# Patient Record
Sex: Female | Born: 1965 | Race: White | Hispanic: No | Marital: Married | Smoking: Former smoker
Health system: Southern US, Community
[De-identification: ages and names within clinical notes are randomized; demographics above are authoritative.]

## PROBLEM LIST (undated history)

## (undated) ENCOUNTER — Emergency Department (HOSPITAL_COMMUNITY): Discharge status: Absent without leave | Payer: Self-pay

## (undated) DIAGNOSIS — M5127 Other intervertebral disc displacement, lumbosacral region: Secondary | ICD-10-CM

## (undated) DIAGNOSIS — G4730 Sleep apnea, unspecified: Secondary | ICD-10-CM

## (undated) DIAGNOSIS — D649 Anemia, unspecified: Secondary | ICD-10-CM

## (undated) DIAGNOSIS — R51 Headache: Secondary | ICD-10-CM

## (undated) DIAGNOSIS — F329 Major depressive disorder, single episode, unspecified: Secondary | ICD-10-CM

## (undated) DIAGNOSIS — J189 Pneumonia, unspecified organism: Secondary | ICD-10-CM

## (undated) DIAGNOSIS — K219 Gastro-esophageal reflux disease without esophagitis: Secondary | ICD-10-CM

## (undated) DIAGNOSIS — F32A Depression, unspecified: Secondary | ICD-10-CM

## (undated) DIAGNOSIS — I10 Essential (primary) hypertension: Secondary | ICD-10-CM

## (undated) DIAGNOSIS — E7800 Pure hypercholesterolemia, unspecified: Secondary | ICD-10-CM

## (undated) HISTORY — PX: NOSE SURGERY: SHX723

## (undated) HISTORY — PX: BACK SURGERY: SHX140

## (undated) HISTORY — PX: KNEE SURGERY: SHX244

---

## 1997-10-17 ENCOUNTER — Observation Stay (HOSPITAL_COMMUNITY): Admission: AD | Admit: 1997-10-17 | Discharge: 1997-10-18 | Payer: Self-pay | Admitting: *Deleted

## 1997-11-03 ENCOUNTER — Inpatient Hospital Stay (HOSPITAL_COMMUNITY): Admission: AD | Admit: 1997-11-03 | Discharge: 1997-11-03 | Payer: Self-pay | Admitting: *Deleted

## 1997-11-08 ENCOUNTER — Inpatient Hospital Stay (HOSPITAL_COMMUNITY): Admission: AD | Admit: 1997-11-08 | Discharge: 1997-11-11 | Payer: Self-pay | Admitting: *Deleted

## 1997-11-18 ENCOUNTER — Encounter: Admission: RE | Admit: 1997-11-18 | Discharge: 1998-02-16 | Payer: Self-pay | Admitting: *Deleted

## 1998-10-10 ENCOUNTER — Encounter: Admission: RE | Admit: 1998-10-10 | Discharge: 1998-12-01 | Payer: Self-pay | Admitting: Specialist

## 1999-06-11 ENCOUNTER — Other Ambulatory Visit: Admission: RE | Admit: 1999-06-11 | Discharge: 1999-06-11 | Payer: Self-pay | Admitting: Family Medicine

## 2000-11-10 ENCOUNTER — Encounter: Payer: Self-pay | Admitting: Family Medicine

## 2000-11-10 ENCOUNTER — Encounter: Admission: RE | Admit: 2000-11-10 | Discharge: 2000-11-10 | Payer: Self-pay | Admitting: Family Medicine

## 2001-01-21 ENCOUNTER — Other Ambulatory Visit: Admission: RE | Admit: 2001-01-21 | Discharge: 2001-01-21 | Payer: Self-pay | Admitting: Emergency Medicine

## 2002-03-23 ENCOUNTER — Other Ambulatory Visit: Admission: RE | Admit: 2002-03-23 | Discharge: 2002-03-23 | Payer: Self-pay | Admitting: Internal Medicine

## 2002-07-28 ENCOUNTER — Encounter: Admission: RE | Admit: 2002-07-28 | Discharge: 2002-10-26 | Payer: Self-pay | Admitting: Family Medicine

## 2003-04-19 ENCOUNTER — Other Ambulatory Visit: Admission: RE | Admit: 2003-04-19 | Discharge: 2003-04-19 | Payer: Self-pay | Admitting: Family Medicine

## 2003-12-28 ENCOUNTER — Ambulatory Visit (HOSPITAL_COMMUNITY): Admission: RE | Admit: 2003-12-28 | Discharge: 2003-12-28 | Payer: Self-pay | Admitting: Family Medicine

## 2004-05-02 ENCOUNTER — Other Ambulatory Visit: Admission: RE | Admit: 2004-05-02 | Discharge: 2004-05-02 | Payer: Self-pay | Admitting: Family Medicine

## 2004-09-07 ENCOUNTER — Encounter: Admission: RE | Admit: 2004-09-07 | Discharge: 2004-09-07 | Payer: Self-pay | Admitting: Specialist

## 2004-10-10 ENCOUNTER — Encounter: Admission: RE | Admit: 2004-10-10 | Discharge: 2004-10-10 | Payer: Self-pay | Admitting: Specialist

## 2004-11-22 ENCOUNTER — Observation Stay (HOSPITAL_COMMUNITY): Admission: RE | Admit: 2004-11-22 | Discharge: 2004-11-23 | Payer: Self-pay | Admitting: Specialist

## 2004-11-22 ENCOUNTER — Encounter (INDEPENDENT_AMBULATORY_CARE_PROVIDER_SITE_OTHER): Payer: Self-pay | Admitting: *Deleted

## 2005-05-09 ENCOUNTER — Other Ambulatory Visit: Admission: RE | Admit: 2005-05-09 | Discharge: 2005-05-09 | Payer: Self-pay | Admitting: Family Medicine

## 2005-07-22 DIAGNOSIS — J189 Pneumonia, unspecified organism: Secondary | ICD-10-CM

## 2005-07-22 HISTORY — DX: Pneumonia, unspecified organism: J18.9

## 2005-12-14 ENCOUNTER — Emergency Department (HOSPITAL_COMMUNITY): Admission: EM | Admit: 2005-12-14 | Discharge: 2005-12-14 | Payer: Self-pay | Admitting: Emergency Medicine

## 2006-05-15 ENCOUNTER — Other Ambulatory Visit: Admission: RE | Admit: 2006-05-15 | Discharge: 2006-05-15 | Payer: Self-pay | Admitting: Family Medicine

## 2007-05-26 ENCOUNTER — Other Ambulatory Visit: Admission: RE | Admit: 2007-05-26 | Discharge: 2007-05-26 | Payer: Self-pay | Admitting: Family Medicine

## 2007-08-26 ENCOUNTER — Ambulatory Visit (HOSPITAL_COMMUNITY): Admission: RE | Admit: 2007-08-26 | Discharge: 2007-08-26 | Payer: Self-pay | Admitting: Family Medicine

## 2008-01-21 ENCOUNTER — Observation Stay (HOSPITAL_COMMUNITY): Admission: RE | Admit: 2008-01-21 | Discharge: 2008-01-22 | Payer: Self-pay | Admitting: Specialist

## 2008-07-06 ENCOUNTER — Other Ambulatory Visit: Admission: RE | Admit: 2008-07-06 | Discharge: 2008-07-06 | Payer: Self-pay | Admitting: Family Medicine

## 2010-07-17 ENCOUNTER — Ambulatory Visit (HOSPITAL_COMMUNITY)
Admission: RE | Admit: 2010-07-17 | Discharge: 2010-07-17 | Payer: Self-pay | Source: Home / Self Care | Attending: Family Medicine | Admitting: Family Medicine

## 2010-09-07 ENCOUNTER — Other Ambulatory Visit: Payer: Self-pay | Admitting: Family Medicine

## 2010-09-07 ENCOUNTER — Other Ambulatory Visit (HOSPITAL_COMMUNITY)
Admission: RE | Admit: 2010-09-07 | Discharge: 2010-09-07 | Disposition: A | Discharge status: Home / Self Care | Payer: Self-pay | Source: Ambulatory Visit | Attending: Family Medicine | Admitting: Family Medicine

## 2010-09-07 DIAGNOSIS — Z124 Encounter for screening for malignant neoplasm of cervix: Secondary | ICD-10-CM | POA: Insufficient documentation

## 2010-12-04 NOTE — Op Note (Signed)
Brianna Black, Brianna Black              ACCOUNT NO.:  [id number]   MEDICAL RECORD NO.:  [id number]          PATIENT TYPE:  AMB   LOCATION:  DAY                          FACILITY:  [facility]   PHYSICIAN:  Jene Every, M.D.    DATE OF BIRTH:  [birth year]   DATE OF PROCEDURE:  01/21/2008  DATE OF DISCHARGE:                               OPERATIVE REPORT   PREOPERATIVE DIAGNOSES:  1. Spinal stenosis.  2. Herniated nucleus pulposus, L5-S1, right.   POSTOPERATIVE DIAGNOSES:  1. Spinal stenosis.  2. Herniated nucleus pulposus, L5-S1, right.   PROCEDURES PERFORMED:  1. Lateral recess decompression.  2. Foraminotomy of S1.  3. Microdiskectomy, L5-S1.   ANESTHESIA:  General.   ASSISTANT:  Roma Schanz, P.A.   BRIEF HISTORY AND INDICATION:  A 45 year old with right lower extremity  radiculopathy, paracentral disk herniation 5-1, with disk herniation and  history of a disk degeneration and HNP at 4-5, the MRI indicating  paracentral to the right with S1 nerve root compression.  On exam today  preoperatively, she had a positive straight leg raise on the right,  negative on the left.  Radiation of the pain into the lateral aspect of  the foot in the S1 dermatome.  She had no L5 symptoms.   Operative intervention is indicated for decompression of the S1 nerve  root.  Risks and benefits discussed including bleeding, infection,  damage to vascular structures, CSF leakage, epidural fibrosis, the need  for fusion in the future, anesthetic complications, etc.   DESCRIPTION OF PROCEDURE:  With the patient in supine position after  induction of adequate anesthesia and 2 grams of Kefzol, she was placed  prone on the [place] frame.  All bony points were well padded.  Lumbar  region prepped and draped in the usual sterile fashion.  Two 18 gauge  spinal needles utilized to localize the 5-1 interspace, confirmed with x-  ray.   Incision made from the spinous process of L5-S1.  Subcutaneous tissue  was dissected.  Cautery was used to achieve hemostasis.  Dorsolumbar  fascia identified by the skin incision.  Paraspinous muscle elevated  from lamina of L5 and S1.  McCullough retractor was placed.  Operating  microscope was draped on the surgical field.  Second confirmatory  radiograph obtained with a Penfield 4r in the interlaminar space.  Straight curette was utilized to detach ligamentum flavum from the  cephalad edge of S1.  High-speed bur was utilized to form a  hemilaminotomy at L5.  There was hypertrophic facet noted.  A 2 mm  Kerrison was utilized to decompress the facet to the medial border of  pedicle.  Twenty-five percent of that was removed.  Ligamentum flavum  removed from the interspace.  It was a small interspace, fairly severe  stenosis laterally was noted.  She had an epidural venous plexus noted.  A 2 mm Kerrison was utilized to form a foraminotomy of S1 to identify  the S1 nerve root.  When this was identified and gently mobilized,  medially, we decompressed the lateral recess of the medial border of  pedicle.  Hemilaminotomy was completed  and the caudad edge of 5  ligamentum flavum removed.  There was a vascular leash over the S1 nerve  root.  This was divided and lysed to further mobilize the S1 nerve root.  Focal HNP was noted.  Annulotomy was performed.  Copious portions of  disk material were removed from the disk space with a straight and  upbiting pituitary.  It was further mobilized with a nerve hook and a  hockey stick.  After multiple passes were made, a full diskectomy of  herniated material was obtained.  Next, a hockey stick probe was placed  out the foramen of S1 and 5, and found be widely patent.  This space was  copiously irrigated with antibiotic irrigation.  Inspection revealed no  CSF leakage, but at least an excursion of 1 cm medial to the pedicle.  Next inspection revealed no evidence of CSF leakage or active bleeding.  The McCullough retractor  was removed.  Paraspinous muscle inspected, no  evidence of active bleeding.  Dorsolumbar fascia reapproximated with #1  Vicryl interrupted figure-of-eight sutures.  Subcutaneous tissue  reapproximated with 2-0 Vicryl simple sutures.  Skin was reapproximated  with 4 subcuticular Prolene.  Wound reinforced with Steri-Strips.  Sterile dressing applied.  Placed supine on the hospital bed, extubated  without difficulty and transported to the recovery room in satisfactory  condition.  Blood loss is minimal.      Jene Every, M.D.  Electronically Signed     JB/MEDQ  D:  01/21/2008  T:  01/21/2008  Job:  [id number]

## 2010-12-07 NOTE — Op Note (Signed)
Brianna Black, Brianna Black              ACCOUNT NO.:  [id number]   MEDICAL RECORD NO.:  [id number]          PATIENT TYPE:  AMB   LOCATION:  DAY                          FACILITY:  [facility]   PHYSICIAN:  Jene Every, M.D.    DATE OF BIRTH:  July 04, 1966   DATE OF PROCEDURE:  11/22/2004  DATE OF DISCHARGE:                                 OPERATIVE REPORT   PREOPERATIVE DIAGNOSIS:  Spinal stenosis, herniated nucleus pulposus, L4-5  on left.   POSTOPERATIVE DIAGNOSIS:  Spinal stenosis, herniated nucleus pulposus, L4-5  on left.   PROCEDURE PERFORMED:  Lateral recess decompression, foraminotomy of L5,  microdiskectomy L4-5.   ANESTHESIA:  General.   ASSISTANT:  Roma Schanz, P.A.   BRIEF HISTORY AND INDICATIONS:  This is a 45 year old female with L5  radiculopathy, positive neural tension sign, EHL weakness with a MRI  indicating lateral recess stenosis and disk protrusion. She had been  refractory to conservative treatment including epidural steroid injection  which gave her temporary relief, protecting the L4-5 space. It was  diagnostic and temporarily therapeutic, positive neural tension sign, EHL  weakness and the MRI indicating lateral recess stenosis which positionally  was exacerbated, and she was indicated for decompression of the L5 nerve  root. Risks and benefits were discussed including bleeding, infection,  damage to vascular structures, CSF leakage, epidural fibrosis, adjacent  segment disease, need for fusion in the future.   The patient was placed in the supine position. After the induction of  adequate general anesthesia and 1 g of Kefzol, she was placed prone on the  [place] frame, all bony prominences well padded. Lumbar region was prepped  and draped in the usual sterile fashion. A 22-gauge spinal needle was  utilized to localize the 4-5 interspace and was confirmed with an x-ray.  Incision was made from spinous process of 4-5. Subcutaneous tissue  dissected.  Electrocautery was utilized to achieve hemostasis.  The  dorsolumbar fascia was identified divided in line with the skin incision.  The paraspinous muscle was elevated from the lamina of 4 and 5. A McCullough  retractor was placed and a Penfield 4 in the intralaminar space at above and  below the lamina, presumed to be of 4. Confirmatory radiograph obtained, a  lower Penfield 4 in the intralaminar space, 4-5. The McCullough retractor  was between the spinous processes at 4 and 5. The patient had slight laminar  and facet trophism as on the AP by direct visualization. There was an  enlarged facet and a prominence off the inferior aspect of the L4 lamina.  Hemilaminotomy in the caudad edge of L4 was performed with 3-mm Kerrison.  Ligamentum flavum detached from the cephalad edge of 5. Noted immediately  was severe compression at the L5 nerve root into the lateral recess.  We  meticulously performed a L5 foraminotomy. The facet prominence was projected  into and compressing the L5 root along with a HNP. I decompressed the facet  to the median border of the pedicle. Ligamentum flavum removed from the  interspace. The hemilaminotomy at 4 carried to the insertion of the  ligamentum.  We then gently mobilized the root medially. Noted there were  remnants of the epidural steroid injection which was an opaque substance  consistent with that, and that was removed as well. It was right in the area  where the nerve was compressed. It was a vascular release, it was divided  and cauterized. There was a focal HNP.  I performed an annulotomy and  diskectomy of herniated material by a straight and upbiting pituitary.  I  checked the axilla beneath the thecal sac __with no______________ residual  compression. The root was freely mobile approximately a centimeter medial to  the pedicle. Electrocautery was utilized to achieve hemostasis. Copiously  irrigated the disk space with antibiotic irrigation. Again noted  the  excellent decompression of the 5 root noted. It was pulsatile, no evidence  of CSF leakage, placed thrombin soaked Gelfoam in the laminotomy defect.  Removed the McCullough retractor, paraspinous muscle was inspected, no  evidence of active bleeding and the dorsolumbar fascia was reapproximated  with #1 Vicryl figure-of-eight sutures. Subcutaneous tissue reapproximated  with 2-0 Vicryl simple sutures. Skin was reapproximated with 4-0  subcuticular Prolene. The wound was reinforced with Steri-Strips, sterile  dressing applied. Placed supine on the hospital bed, extubated without  difficulty, transported to the recovery room in satisfactory condition.   The patient tolerated the procedure well with no complications.      JB/MEDQ  D:  11/22/2004  T:  11/22/2004  Job:  [id number]

## 2011-04-18 LAB — URINALYSIS, ROUTINE W REFLEX MICROSCOPIC
Bilirubin Urine: NEGATIVE
Glucose, UA: NEGATIVE
Ketones, ur: NEGATIVE
Leukocytes, UA: NEGATIVE
Nitrite: NEGATIVE
Protein, ur: NEGATIVE
Urobilinogen, UA: 0.2

## 2011-04-18 LAB — COMPREHENSIVE METABOLIC PANEL
ALT: 14
Albumin: 3.6
Alkaline Phosphatase: 61
BUN: 16
Potassium: 3.2 — ABNORMAL LOW
Sodium: 141
Total Bilirubin: 0.5

## 2011-04-18 LAB — APTT: aPTT: 31

## 2011-04-18 LAB — CBC
MCHC: 33.8
RBC: 4.31
RDW: 14.6
WBC: 7

## 2011-04-18 LAB — BASIC METABOLIC PANEL
BUN: 6
Calcium: 8.4
GFR calc non Af Amer: >60
Sodium: 138

## 2011-04-18 LAB — URINE MICROSCOPIC-ADD ON

## 2011-04-18 LAB — PREGNANCY, URINE: Preg Test, Ur: NEGATIVE

## 2011-07-18 ENCOUNTER — Other Ambulatory Visit (HOSPITAL_COMMUNITY): Payer: Self-pay | Admitting: Family Medicine

## 2011-07-18 DIAGNOSIS — Z1231 Encounter for screening mammogram for malignant neoplasm of breast: Secondary | ICD-10-CM

## 2011-08-22 ENCOUNTER — Ambulatory Visit (HOSPITAL_COMMUNITY)
Admission: RE | Admit: 2011-08-22 | Discharge: 2011-08-22 | Disposition: A | Discharge status: Home / Self Care | Payer: 59 | Source: Ambulatory Visit | Attending: Family Medicine | Admitting: Family Medicine

## 2011-08-22 DIAGNOSIS — Z1231 Encounter for screening mammogram for malignant neoplasm of breast: Secondary | ICD-10-CM | POA: Insufficient documentation

## 2012-06-23 ENCOUNTER — Other Ambulatory Visit: Payer: Self-pay | Admitting: Family Medicine

## 2012-06-23 DIAGNOSIS — R140 Abdominal distension (gaseous): Secondary | ICD-10-CM

## 2012-06-26 ENCOUNTER — Ambulatory Visit
Admission: RE | Admit: 2012-06-26 | Discharge: 2012-06-26 | Disposition: A | Discharge status: Home / Self Care | Payer: 59 | Source: Ambulatory Visit | Attending: Family Medicine | Admitting: Family Medicine

## 2012-06-26 DIAGNOSIS — R140 Abdominal distension (gaseous): Secondary | ICD-10-CM

## 2012-07-14 ENCOUNTER — Other Ambulatory Visit: Payer: Self-pay | Admitting: Gastroenterology

## 2013-06-18 ENCOUNTER — Emergency Department (HOSPITAL_COMMUNITY)
Admission: EM | Admit: 2013-06-18 | Discharge: 2013-06-18 | Disposition: A | Discharge status: Home / Self Care | Payer: BC Managed Care – PPO | Attending: Emergency Medicine | Admitting: Emergency Medicine

## 2013-06-18 ENCOUNTER — Encounter (HOSPITAL_COMMUNITY): Payer: Self-pay | Admitting: Emergency Medicine

## 2013-06-18 DIAGNOSIS — M545 Low back pain, unspecified: Secondary | ICD-10-CM

## 2013-06-18 DIAGNOSIS — I10 Essential (primary) hypertension: Secondary | ICD-10-CM | POA: Insufficient documentation

## 2013-06-18 DIAGNOSIS — Z9889 Other specified postprocedural states: Secondary | ICD-10-CM | POA: Insufficient documentation

## 2013-06-18 DIAGNOSIS — E7800 Pure hypercholesterolemia, unspecified: Secondary | ICD-10-CM | POA: Insufficient documentation

## 2013-06-18 DIAGNOSIS — Z79899 Other long term (current) drug therapy: Secondary | ICD-10-CM | POA: Insufficient documentation

## 2013-06-18 DIAGNOSIS — Z880 Allergy status to penicillin: Secondary | ICD-10-CM | POA: Insufficient documentation

## 2013-06-18 DIAGNOSIS — Z8719 Personal history of other diseases of the digestive system: Secondary | ICD-10-CM | POA: Insufficient documentation

## 2013-06-18 HISTORY — DX: Pure hypercholesterolemia, unspecified: E78.00

## 2013-06-18 HISTORY — DX: Essential (primary) hypertension: I10

## 2013-06-18 MED ORDER — HYDROCODONE-ACETAMINOPHEN 7.5-325 MG PO TABS: 1.0000 | ORAL_TABLET | Freq: Four times a day (QID) | ORAL | Status: DC | PRN

## 2013-06-18 MED ORDER — CYCLOBENZAPRINE HCL 5 MG PO TABS: 5.0000 mg | ORAL_TABLET | Freq: Three times a day (TID) | ORAL | Status: DC | PRN

## 2013-06-18 MED ORDER — PREDNISONE 50 MG PO TABS: ORAL_TABLET | ORAL | Status: DC

## 2013-06-18 NOTE — ED Notes (Signed)
Patient with history of herniated discs and multiple back surgeries, started Wed with lower back pain radiating down her right leg.  Denies numbness or tingling.  Rates pain as a 10 and describes it as sharp.  Patient reports she has not been able to have a BM since Tues.

## 2013-06-18 NOTE — ED Provider Notes (Signed)
I saw and evaluated the patient, reviewed the resident's note and I agree with the findings and plan.  EKG Interpretation   None      Patient has recurrent lumbar radicular type pain radiating to her foot without weakness or numbness or change in bowel or bladder function or fever or IV drug abuse.  Hurman Horn, MD 06/18/13 2012

## 2013-06-18 NOTE — ED Provider Notes (Signed)
CSN: [id number]     Arrival date & time 06/18/13  1026 History   First MD Initiated Contact with Patient 06/18/13 1122     Chief Complaint  Patient presents with  . Back Pain   (Consider location/radiation/quality/duration/timing/severity/associated sxs/prior Treatment) Patient is a 48 y.o. female presenting with back pain.  Back Pain   Brianna Black is a 47 y.o. woman who presents with a cc of low back pain. The pain is severe and started when she woke up two days ago in the morning. Before this, she reports mild discomfort since picking up toys at church. The pain is located on the right low back and radiates to her hip down her posterior thigh into the the calf and along the bottom of the foot. She has tried muscle relaxants and vicodin(5/325) that she had left over with minimal relief. She had moderate relief with Vicodin 7.5 mg/325 APAP. The patient admits to constipation for the last few days that is similar to her baseline but a little worse. No changes in bladder function. Denies saddle anesthesia, numbness, weakness, tingling.  Past Medical History  Diagnosis Date  . Hypertension   . Hypercholesteremia    Past Surgical History  Procedure Laterality Date  . Back surgery    . Knee surgery    . Cesarean section     No family history on file. History  Substance Use Topics  . Smoking status: Not on file  . Smokeless tobacco: Not on file  . Alcohol Use: Not on file   OB History   Grav Para Term Preterm Abortions TAB SAB Ect Mult Living                 Review of Systems  Musculoskeletal: Positive for back pain.    Allergies  Ciprofloxacin hcl and Penicillins  Home Medications   Current Outpatient Rx  Name  Route  Sig  Dispense  Refill  . atenolol (TENORMIN) 25 MG tablet   Oral   Take 25 mg by mouth at bedtime.         . gabapentin (NEURONTIN) 400 MG capsule   Oral   Take 400-800 mg by mouth 2 (two) times daily. 2 caps in am and 1 cap hs         .  hydrochlorothiazide (MICROZIDE) 12.5 MG capsule   Oral   Take 12.5 mg by mouth every morning.         Marland Kitchen ibuprofen (ADVIL,MOTRIN) 200 MG tablet   Oral   Take 800 mg by mouth every 6 (six) hours as needed (pain).         . Multiple Vitamin (MULTIVITAMIN WITH MINERALS) TABS tablet   Oral   Take 1 tablet by mouth daily.         . norethindrone (CAMILA) 0.35 MG tablet   Oral   Take 1 tablet by mouth every morning.         . simvastatin (ZOCOR) 20 MG tablet   Oral   Take 20 mg by mouth at bedtime.          BP 107/72  Pulse 86  Temp(Src) 98.4 F (36.9 C) (Oral)  Resp 20  Wt 197 lb (89.359 kg)  SpO2 96%  LMP 06/10/2013 Physical Exam  Constitutional: She is oriented to person, place, and time.  Cardiovascular: Normal rate, regular rhythm, normal heart sounds and intact distal pulses.  Exam reveals no gallop and no friction rub.   No murmur heard. Pulmonary/Chest: Effort  normal and breath sounds normal. No respiratory distress. She has no wheezes. She has no rales.  Abdominal: Soft. Bowel sounds are normal. She exhibits no distension. There is no tenderness. There is no rebound and no guarding.  Musculoskeletal:  Tender to palpation in the midline lumbar spine and R>L paraspinal areas. Bilateral LE non-tender without new rashes or color change, decreased ROM 2/2 pain with intact DP/PT pulses. CR<2 secs all digits bilaterally, sensation baseline light touch bil for pt. DTR's symmetric and intact bil (KJ). Motor symmetric bil at hip flexion, quadriceps, hamstrings, EHL, foot dorsiflexion, foot plantar flexion, gait normal without ataxia.   Neurological: She is alert and oriented to person, place, and time. She has normal reflexes.  Psychiatric: She has a normal mood and affect. Her behavior is normal.    ED Course  Procedures (including critical care time) Labs Review Labs Reviewed - No data to display Imaging Review No results found.  EKG Interpretation   None        MDM   1. Low back pain radiating to right leg     1. Low Back Pain The patient likely has lumbar radiculopathy. There are no signs of cord compression of cauda equina syndrome at this time. She plans to f/u with spine specialist early next week. I recommended using PO steroids for the next 5 days as well as a shot course of pain medications. The patient is in agreement with that plan.   Pleas Koch, MD 06/18/13 [id number]

## 2013-06-29 ENCOUNTER — Other Ambulatory Visit: Payer: Self-pay | Admitting: Specialist

## 2013-06-29 DIAGNOSIS — M48061 Spinal stenosis, lumbar region without neurogenic claudication: Secondary | ICD-10-CM

## 2013-07-01 ENCOUNTER — Ambulatory Visit
Admission: RE | Admit: 2013-07-01 | Discharge: 2013-07-01 | Disposition: A | Discharge status: Home / Self Care | Payer: BC Managed Care – PPO | Source: Ambulatory Visit | Attending: Specialist | Admitting: Specialist

## 2013-07-01 VITALS — BP 149/79 | HR 75

## 2013-07-01 DIAGNOSIS — M48061 Spinal stenosis, lumbar region without neurogenic claudication: Secondary | ICD-10-CM

## 2013-07-01 DIAGNOSIS — M5126 Other intervertebral disc displacement, lumbar region: Secondary | ICD-10-CM

## 2013-07-01 MED ORDER — METHYLPREDNISOLONE ACETATE 40 MG/ML INJ SUSP (RADIOLOG
120.0000 mg | Freq: Once | INTRAMUSCULAR | Status: AC
  Administered 2013-07-01: 120 mg via EPIDURAL

## 2013-07-01 MED ORDER — IOHEXOL 180 MG/ML  SOLN
1.0000 mL | Freq: Once | INTRAMUSCULAR | Status: AC | PRN
  Administered 2013-07-01: 1 mL via EPIDURAL

## 2013-07-21 ENCOUNTER — Encounter (HOSPITAL_COMMUNITY): Payer: Self-pay | Admitting: Pharmacy Technician

## 2013-07-21 ENCOUNTER — Other Ambulatory Visit: Payer: Self-pay | Admitting: Orthopedic Surgery

## 2013-07-21 NOTE — Patient Instructions (Addendum)
20 NOLAH KRENZER  07/21/2013   Your procedure is scheduled on: 07/26/13  Report to [facility] at 1:00 PM.  Call this number if you have problems the morning of surgery [phone]: [phone]   Remember:   Do not eat food After Midnight, clear liquids from midnight until 9:30am on 07/26/13 then nothing.      Take these medicines the morning of surgery with A SIP OF WATER: flonase, gabapentin, hydrocodone if needed   Do not wear jewelry, make-up or nail polish.  Do not wear lotions, powders, or perfumes. You may wear deodorant.  Do not shave 48 hours prior to surgery. Men may shave face and neck.  Do not bring valuables to the hospital.  Contacts, dentures or bridgework may not be worn into surgery.  Leave suitcase in the car. After surgery it may be brought to your room.  For patients admitted to the hospital, checkout time is 11:00 AM the day of discharge.   Please read over the following fact sheets that you were given: MRSA Information, incentive spirometry fact sheet Birdie Sons, RN  pre op nurse call if needed [phone]    FAILURE TO FOLLOW THESE INSTRUCTIONS MAY RESULT IN CANCELLATION OF YOUR SURGERY   Patient Signature: ___________________________________________

## 2013-07-21 NOTE — H&P (Signed)
Donnie AhoLinda A Lucchetti is an 47 y.o. female.   Chief Complaint: back and right leg pain HPI: The patient is a 47 year old female being followed for their low back symptoms. They are now 4 1/2 weeks out from a flare up. Symptoms reported today include: pain and leg pain. The patient states that they are doing poorly. Current treatment includes: home exercise program, relative rest, activity modification, NSAIDs and pain medications. The following medication has been used for pain control: antiinflammatory medication (diclofenac), Percocet and Neurontin. The patient reports their current pain level to be severe. The patient has not gotten any relief of their symptoms with activity modification, conservative measures or NSAIDs. Note for "Follow-up back": Bonita QuinLinda is c/o increased pain that started on 07/15/2013. She reports no change in activity, although she was shopping the day before for last-minute Christmas things. She denies any specific injury but woke up with worsening pain. She reports minimal back pain but severe R buttock, posterior leg pain to the lateral foot with numbness and tingling, weakness in the right leg especially the buttock. She did have prior lumbar decompression L5-S1 right in July 2009 by Dr. Shelle IronBeane. At last visit she was 70% better following S1 SNRB, reports the day after the injection was the best. She has tried to increase the Neurontin with no help. She is taking 2 or 3 percocet at a time for relief and having trouble sleeping. She was previously more comfortable standing but now even that is painful. She is using a cane to walk.  Past Medical History  Diagnosis Date  . Hypertension   . Hypercholesteremia     Past Surgical History  Procedure Laterality Date  . Back surgery    . Knee surgery    . Cesarean section      No family history on file. Social History:  reports that she quit smoking about 29 years ago. Her smoking use included Cigarettes. She smoked 0.00 packs per day. She  does not have any smokeless tobacco history on file. Her alcohol and drug histories are not on file.  Allergies:  Allergies  Allergen Reactions  . Ciprofloxacin Hcl     Stomach cramps  . Penicillins Rash    As a child     (Not in a hospital admission)  No results found for this or any previous visit (from the past 48 hour(s)). No results found.  Review of Systems  Constitutional: Negative.   HENT: Negative.   Eyes: Negative.   Respiratory: Negative.   Cardiovascular: Negative.   Gastrointestinal: Negative.   Genitourinary: Negative.   Musculoskeletal: Positive for back pain.  Skin: Negative.   Neurological: Positive for sensory change and focal weakness.    There were no vitals taken for this visit. Physical Exam  Constitutional: She is oriented to person, place, and time. She appears well-developed and well-nourished. She appears distressed.  HENT:  Head: Normocephalic and atraumatic.  Eyes: Conjunctivae and EOM are normal. Pupils are equal, round, and reactive to light.  Neck: Normal range of motion. Neck supple.  Cardiovascular: Normal rate and regular rhythm.   Respiratory: Effort normal and breath sounds normal.  GI: Soft. Bowel sounds are normal.  Musculoskeletal:  General Mental Status - Alert. General Appearance- pleasant and In acute distress. appears uncomfortable. Note: alternates between sitting and leaning left and standing, pacing Orientation- Oriented X3. Build & Nutrition- Overweight. Gait- Antalgic.  Abdomen Palpation/Percussion:Tenderness- Abdomen is non-tender to palpation. Rigidity (guarding)- Abdomen is soft.  Peripheral Vascular Lower Extremity:  Palpation:Homan's sign- Bilateral- Negative (normal). Posterior tibial pulse- Bilateral- 2+. Dorsalis pedis pulse- Bilateral- 2+.  Neurologic Motor: Strength :Hip Flexion- Left- 5/5. Right- 4-/5. Quadriceps- Bilateral- 5/5. Hamstrings- Bilateral- 5/5. Ankle Dorsiflexion-  Bilateral- 5/5. Ankle Plantarflexion- Bilateral- 5/5. Extensor Hallucis Longus- Left- 5/5. Right- 4/5. Sensation:Lower Extremity- Bilateral- sensation is intact to light touch in the lower extremity. Reflexes:Patellar Reflex- Bilateral- 2+. Achilles Reflex- Bilateral- 2+. Babinski- Bilateral- Babinski not present. Clonus- Bilateral- clonus not present.  Musculoskeletal Spine/Ribs/Pelvis Lumbosacral Spine:Inspection and Palpation- Tenderness- lumbar spinous processes tender to palpation, left lumbar paraspinals tender to palpation, right lumbar paraspinals tender to palpation, right buttock is tender to palpation and right greater trochanter is tender to palpation. no tenderness to palpation of the right flank, no tenderness to palpation of right flank and no tenderness to palpation of the left greater trochanter. Swelling- none. Surrounding tissue tension/texture is - soft. Sensation- normal. Other characteristics- no ecchymosis, no abnormal warmth, no erythema and no evidence of cellulitis. ROM- Flexion- moderately decreased range of motion. Extension- moderately decreased range of motion. Testing limited- due to pain. Special Testing- Lumbar- Right seated straight leg raise positive (produces buttock and leg pain). Left seated straight leg raise negative. Lower Extremity Right Lower Extremity: Right Hip: ROM:- Full ROM of the hip and - pain-free. Right Knee: ROM:- Full ROM of the knee and - pain-free. Right Ankle: ROM:- Full and pain-free ROM of the ankle. Left Lower Extremity: Left Hip : ROM:- Full ROM of the hip and - pain-free. Left Knee: ROM:- Full ROM of the knee and - pain-free. Left Ankle: ROM:- Full and pain-free ROM of the ankle.  Lymphatic General Lymphatics Description- No Localized lymphadenopathy.  Neurological: She is alert and oriented to person, place, and time. She has normal reflexes.  Skin: Skin is warm and dry.   Psychiatric: She has a normal mood and affect.     MRI Lspine again reviewed by Dr. Shelle Iron with small right sided HNP L5-S1 with epidural fibrosis displacing the S1 root.  Assessment/Plan Recurrent HNP L5-S1 right  Pt with ongoing, worsening RLE radiculopathy, dermatomal dysthesias, myotomal weakness, S1 distribution, due to recurrent HNP L5-S1 right, prior hx of microdiscectomy L5-S1 right in 2009, current flareup for approx 4.5 weeks. Refractory to NSAIDs, neurontin, pain medication, muscle relaxers, relative rest, activity modification, SNRB, HEP. Her symptoms have actually worsened, she is now walking with a cane. We discussed tx options. Given her limited relief with SNRB S1, would not recommend repeat SNRB. Given her ongoing and worsening symptoms, weakness, numbness, and duration of 4.5 weeks now, it is certainly reasonable to consider surgical intervention, redo microlumbar decompression L5-S1 right. We discussed surgery itself as well as risks, complications, and alternatives including but not limited to DVT, PE, infx, bleeding, failure of procedure, need for secondary procedure, anesthesia risk, dural tear, CSF leak, even death. Discussed post-op protocols, expected time out of work, restrictions/modifications post-op, need for PT. All her questions were answered and she desires to proceed. She does not feel she can continue to deal with her pain the way it is currently. In the interim, will increase strength of Percocet, continue Flexeril, Ibuprofen, Neurontin. She denies hx of DVT or MRSA. She is on multiple HTN meds but reports good control. Will ask her PCP, Dr. Hyman Hopes, for pre-op clearance prior to surgery. We will proceed accordingly. She will follow up 10-14 days post-op for suture removal.  I had an extensive discussion of the risks and benefits of the lumbar decompression with the patient including  bleeding, infection, damage to neurovascular structures, epidural fibrosis, CSF leak  requiring repair. We also discussed increase in pain, adjacent segment disease, recurrent disc herniation, need for future surgery including repeat decompression and/or fusion. We also discussed risks of postoperative hematoma, paralysis, anesthetic complications including DVT, PE, death, cardiopulmonary dysfunction. In addition, the perioperative and postoperative courses were discussed in detail including the rehabilitative time and return to functional activity and work. I provided the patient with an illustrated handout and utilized the appropriate surgical models.  Plan redo microlumbar decompression L5-S1 right  Kavin Weckwerth M. for Dr. Shelle Iron 07/21/2013, 5:04 PM

## 2013-07-21 NOTE — Progress Notes (Signed)
Surgery scheduled fro 07/26/12 preop on 07/23/13 at 0830am.  Need orders in EPIC.  Thank You.

## 2013-07-23 ENCOUNTER — Encounter (HOSPITAL_COMMUNITY): Payer: Self-pay

## 2013-07-23 ENCOUNTER — Ambulatory Visit (HOSPITAL_COMMUNITY)
Admission: RE | Admit: 2013-07-23 | Discharge: 2013-07-23 | Disposition: A | Discharge status: Home / Self Care | Payer: BC Managed Care – PPO | Source: Ambulatory Visit | Attending: Orthopedic Surgery | Admitting: Orthopedic Surgery

## 2013-07-23 ENCOUNTER — Ambulatory Visit (HOSPITAL_COMMUNITY)
Admission: RE | Admit: 2013-07-23 | Discharge: 2013-07-23 | Disposition: A | Discharge status: Home / Self Care | Payer: BC Managed Care – PPO | Source: Ambulatory Visit | Attending: Specialist | Admitting: Specialist

## 2013-07-23 ENCOUNTER — Encounter (HOSPITAL_COMMUNITY)
Admission: RE | Admit: 2013-07-23 | Discharge: 2013-07-23 | Disposition: A | Discharge status: Home / Self Care | Payer: BC Managed Care – PPO | Source: Ambulatory Visit | Attending: Specialist | Admitting: Specialist

## 2013-07-23 DIAGNOSIS — M51379 Other intervertebral disc degeneration, lumbosacral region without mention of lumbar back pain or lower extremity pain: Secondary | ICD-10-CM | POA: Insufficient documentation

## 2013-07-23 DIAGNOSIS — M5137 Other intervertebral disc degeneration, lumbosacral region: Secondary | ICD-10-CM | POA: Insufficient documentation

## 2013-07-23 DIAGNOSIS — Z01818 Encounter for other preprocedural examination: Secondary | ICD-10-CM | POA: Insufficient documentation

## 2013-07-23 DIAGNOSIS — Z01812 Encounter for preprocedural laboratory examination: Secondary | ICD-10-CM | POA: Insufficient documentation

## 2013-07-23 DIAGNOSIS — Z01810 Encounter for preprocedural cardiovascular examination: Secondary | ICD-10-CM | POA: Insufficient documentation

## 2013-07-23 HISTORY — DX: Pneumonia, unspecified organism: J18.9

## 2013-07-23 HISTORY — DX: Depression, unspecified: F32.A

## 2013-07-23 HISTORY — DX: Sleep apnea, unspecified: G47.30

## 2013-07-23 HISTORY — DX: Headache: R51

## 2013-07-23 HISTORY — DX: Major depressive disorder, single episode, unspecified: F32.9

## 2013-07-23 HISTORY — DX: Anemia, unspecified: D64.9

## 2013-07-23 HISTORY — DX: Gastro-esophageal reflux disease without esophagitis: K21.9

## 2013-07-23 HISTORY — DX: Other intervertebral disc displacement, lumbosacral region: M51.27

## 2013-07-23 LAB — CBC
HCT: 37.6 % (ref 36.0–46.0)
Hemoglobin: 12.7 g/dL (ref 12.0–15.0)
MCH: 30 pg (ref 26.0–34.0)
MCHC: 33.8 g/dL (ref 30.0–36.0)
MCV: 88.9 fL (ref 78.0–100.0)
PLATELETS: 296 10*3/uL (ref 150–400)
RBC: 4.23 MIL/uL (ref 3.87–5.11)
RDW: 13.3 % (ref 11.5–15.5)
WBC: 7.9 10*3/uL (ref 4.0–10.5)

## 2013-07-23 LAB — BASIC METABOLIC PANEL
BUN: 20 mg/dL (ref 6–23)
CALCIUM: 9.5 mg/dL (ref 8.4–10.5)
CO2: 27 mEq/L (ref 19–32)
Chloride: 98 mEq/L (ref 96–112)
Creatinine, Ser: 0.65 mg/dL (ref 0.50–1.10)
Glucose, Bld: 83 mg/dL (ref 70–99)
Potassium: 3.7 mEq/L (ref 3.7–5.3)
SODIUM: 137 meq/L (ref 137–147)

## 2013-07-23 LAB — HCG, SERUM, QUALITATIVE: PREG SERUM: NEGATIVE

## 2013-07-23 LAB — SURGICAL PCR SCREEN
MRSA, PCR: NEGATIVE
STAPHYLOCOCCUS AUREUS: NEGATIVE

## 2013-07-26 ENCOUNTER — Ambulatory Visit (HOSPITAL_COMMUNITY): Payer: BC Managed Care – PPO | Admitting: Anesthesiology

## 2013-07-26 ENCOUNTER — Ambulatory Visit (HOSPITAL_COMMUNITY): Payer: BC Managed Care – PPO

## 2013-07-26 ENCOUNTER — Encounter (HOSPITAL_COMMUNITY): Payer: Self-pay | Admitting: *Deleted

## 2013-07-26 ENCOUNTER — Encounter (HOSPITAL_COMMUNITY): Payer: BC Managed Care – PPO | Admitting: Anesthesiology

## 2013-07-26 ENCOUNTER — Encounter (HOSPITAL_COMMUNITY)
Admission: RE | Disposition: A | Discharge status: Home / Self Care | Payer: Self-pay | Source: Ambulatory Visit | Attending: Specialist

## 2013-07-26 ENCOUNTER — Ambulatory Visit (HOSPITAL_COMMUNITY)
Admission: RE | Admit: 2013-07-26 | Discharge: 2013-07-27 | Disposition: A | Discharge status: Home / Self Care | Payer: BC Managed Care – PPO | Source: Ambulatory Visit | Attending: Specialist | Admitting: Specialist

## 2013-07-26 DIAGNOSIS — K219 Gastro-esophageal reflux disease without esophagitis: Secondary | ICD-10-CM | POA: Insufficient documentation

## 2013-07-26 DIAGNOSIS — M5126 Other intervertebral disc displacement, lumbar region: Secondary | ICD-10-CM

## 2013-07-26 DIAGNOSIS — E7800 Pure hypercholesterolemia, unspecified: Secondary | ICD-10-CM | POA: Insufficient documentation

## 2013-07-26 DIAGNOSIS — Z79899 Other long term (current) drug therapy: Secondary | ICD-10-CM | POA: Insufficient documentation

## 2013-07-26 DIAGNOSIS — M5137 Other intervertebral disc degeneration, lumbosacral region: Secondary | ICD-10-CM | POA: Insufficient documentation

## 2013-07-26 DIAGNOSIS — G4730 Sleep apnea, unspecified: Secondary | ICD-10-CM | POA: Insufficient documentation

## 2013-07-26 DIAGNOSIS — I10 Essential (primary) hypertension: Secondary | ICD-10-CM | POA: Insufficient documentation

## 2013-07-26 DIAGNOSIS — M48062 Spinal stenosis, lumbar region with neurogenic claudication: Secondary | ICD-10-CM | POA: Diagnosis present

## 2013-07-26 DIAGNOSIS — M51379 Other intervertebral disc degeneration, lumbosacral region without mention of lumbar back pain or lower extremity pain: Secondary | ICD-10-CM | POA: Insufficient documentation

## 2013-07-26 HISTORY — PX: LUMBAR LAMINECTOMY/DECOMPRESSION MICRODISCECTOMY: SHX5026

## 2013-07-26 SURGERY — LUMBAR LAMINECTOMY/DECOMPRESSION MICRODISCECTOMY 1 LEVEL
Anesthesia: General | Site: Back | Laterality: Right

## 2013-07-26 MED ORDER — NEOSTIGMINE METHYLSULFATE 1 MG/ML IJ SOLN
INTRAMUSCULAR | Status: DC | PRN
  Administered 2013-07-26: 5 mg via INTRAVENOUS

## 2013-07-26 MED ORDER — SODIUM CHLORIDE 0.9 % IJ SOLN: 3.0000 mL | INTRAMUSCULAR | Status: DC | PRN

## 2013-07-26 MED ORDER — LIDOCAINE HCL (CARDIAC) 20 MG/ML IV SOLN
INTRAVENOUS | Status: AC
  Filled 2013-07-26: qty 5

## 2013-07-26 MED ORDER — CEFAZOLIN SODIUM-DEXTROSE 2-3 GM-% IV SOLR
2.0000 g | Freq: Three times a day (TID) | INTRAVENOUS | Status: AC
  Administered 2013-07-26 – 2013-07-27 (×2): 2 g via INTRAVENOUS
  Filled 2013-07-26 (×2): qty 50

## 2013-07-26 MED ORDER — OXYCODONE-ACETAMINOPHEN 7.5-325 MG PO TABS: 1.0000 | ORAL_TABLET | ORAL | Status: DC | PRN

## 2013-07-26 MED ORDER — NORETHINDRONE 0.35 MG PO TABS
1.0000 | ORAL_TABLET | Freq: Every morning | ORAL | Status: DC
  Administered 2013-07-27: 0.35 mg via ORAL

## 2013-07-26 MED ORDER — EPHEDRINE SULFATE 50 MG/ML IJ SOLN
INTRAMUSCULAR | Status: DC | PRN
  Administered 2013-07-26 (×3): 5 mg via INTRAVENOUS

## 2013-07-26 MED ORDER — MIDAZOLAM HCL 2 MG/2ML IJ SOLN
INTRAMUSCULAR | Status: AC
  Filled 2013-07-26: qty 2

## 2013-07-26 MED ORDER — ACETAMINOPHEN 10 MG/ML IV SOLN
1000.0000 mg | Freq: Once | INTRAVENOUS | Status: AC
  Administered 2013-07-26: 1000 mg via INTRAVENOUS
  Filled 2013-07-26: qty 100

## 2013-07-26 MED ORDER — CEFAZOLIN SODIUM-DEXTROSE 2-3 GM-% IV SOLR
2.0000 g | INTRAVENOUS | Status: AC
  Administered 2013-07-26: 2 g via INTRAVENOUS

## 2013-07-26 MED ORDER — FLUTICASONE PROPIONATE 50 MCG/ACT NA SUSP
1.0000 | Freq: Every day | NASAL | Status: DC | PRN
  Filled 2013-07-26: qty 16

## 2013-07-26 MED ORDER — HYDROMORPHONE HCL PF 1 MG/ML IJ SOLN
0.2500 mg | INTRAMUSCULAR | Status: DC | PRN
  Administered 2013-07-26 (×4): 0.5 mg via INTRAVENOUS

## 2013-07-26 MED ORDER — HYDROMORPHONE HCL PF 1 MG/ML IJ SOLN
INTRAMUSCULAR | Status: AC
  Filled 2013-07-26: qty 1

## 2013-07-26 MED ORDER — LIDOCAINE HCL (CARDIAC) 20 MG/ML IV SOLN
INTRAVENOUS | Status: DC | PRN
  Administered 2013-07-26: 80 mg via INTRAVENOUS

## 2013-07-26 MED ORDER — MEPERIDINE HCL 50 MG/ML IJ SOLN: 6.2500 mg | INTRAMUSCULAR | Status: DC | PRN

## 2013-07-26 MED ORDER — SODIUM CHLORIDE 0.9 % IJ SOLN: 3.0000 mL | Freq: Two times a day (BID) | INTRAMUSCULAR | Status: DC

## 2013-07-26 MED ORDER — ACETAMINOPHEN 325 MG PO TABS: 650.0000 mg | ORAL_TABLET | ORAL | Status: DC | PRN

## 2013-07-26 MED ORDER — 0.9 % SODIUM CHLORIDE (POUR BTL) OPTIME
TOPICAL | Status: DC | PRN
  Administered 2013-07-26: 1000 mL

## 2013-07-26 MED ORDER — OXYCODONE HCL 5 MG/5ML PO SOLN
5.0000 mg | Freq: Once | ORAL | Status: DC | PRN
  Filled 2013-07-26: qty 5

## 2013-07-26 MED ORDER — SODIUM CHLORIDE 0.9 % IR SOLN
Status: DC | PRN
  Administered 2013-07-26: 16:00:00

## 2013-07-26 MED ORDER — OXYCODONE HCL 5 MG PO TABS: 5.0000 mg | ORAL_TABLET | Freq: Once | ORAL | Status: DC | PRN

## 2013-07-26 MED ORDER — FENTANYL CITRATE 0.05 MG/ML IJ SOLN
INTRAMUSCULAR | Status: AC
Start: 2013-07-26 — End: 2013-07-26
  Filled 2013-07-26: qty 5

## 2013-07-26 MED ORDER — NEOSTIGMINE METHYLSULFATE 1 MG/ML IJ SOLN
INTRAMUSCULAR | Status: AC
  Filled 2013-07-26: qty 10

## 2013-07-26 MED ORDER — MIDAZOLAM HCL 5 MG/5ML IJ SOLN
INTRAMUSCULAR | Status: DC | PRN
  Administered 2013-07-26: 2 mg via INTRAVENOUS

## 2013-07-26 MED ORDER — ONDANSETRON HCL 4 MG/2ML IJ SOLN: 4.0000 mg | INTRAMUSCULAR | Status: DC | PRN

## 2013-07-26 MED ORDER — MENTHOL 3 MG MT LOZG
1.0000 | LOZENGE | OROMUCOSAL | Status: DC | PRN
  Filled 2013-07-26: qty 9

## 2013-07-26 MED ORDER — ADULT MULTIVITAMIN W/MINERALS CH
1.0000 | ORAL_TABLET | Freq: Every day | ORAL | Status: DC
  Administered 2013-07-27: 1 via ORAL
  Filled 2013-07-26: qty 1

## 2013-07-26 MED ORDER — LACTATED RINGERS IV SOLN
INTRAVENOUS | Status: DC
  Administered 2013-07-26: 1000 mL via INTRAVENOUS
  Administered 2013-07-26: 19:00:00 via INTRAVENOUS

## 2013-07-26 MED ORDER — PROPOFOL 10 MG/ML IV BOLUS
INTRAVENOUS | Status: AC
  Filled 2013-07-26: qty 20

## 2013-07-26 MED ORDER — CEFAZOLIN SODIUM-DEXTROSE 2-3 GM-% IV SOLR
INTRAVENOUS | Status: AC
  Filled 2013-07-26: qty 50

## 2013-07-26 MED ORDER — HYDROCHLOROTHIAZIDE 12.5 MG PO CAPS
12.5000 mg | ORAL_CAPSULE | Freq: Every morning | ORAL | Status: DC
  Administered 2013-07-27: 12.5 mg via ORAL
  Filled 2013-07-26: qty 1

## 2013-07-26 MED ORDER — ONDANSETRON HCL 4 MG/2ML IJ SOLN
INTRAMUSCULAR | Status: DC | PRN
Start: 2013-07-26 — End: 2013-07-26
  Administered 2013-07-26: 4 mg via INTRAVENOUS

## 2013-07-26 MED ORDER — FENTANYL CITRATE 0.05 MG/ML IJ SOLN
INTRAMUSCULAR | Status: DC | PRN
  Administered 2013-07-26 (×5): 50 ug via INTRAVENOUS

## 2013-07-26 MED ORDER — METHOCARBAMOL 100 MG/ML IJ SOLN
500.0000 mg | Freq: Once | INTRAVENOUS | Status: AC
  Administered 2013-07-26: 500 mg via INTRAVENOUS
  Filled 2013-07-26: qty 5

## 2013-07-26 MED ORDER — DEXAMETHASONE SODIUM PHOSPHATE 10 MG/ML IJ SOLN
INTRAMUSCULAR | Status: DC | PRN
  Administered 2013-07-26: 10 mg via INTRAVENOUS

## 2013-07-26 MED ORDER — GLYCOPYRROLATE 0.2 MG/ML IJ SOLN
INTRAMUSCULAR | Status: DC | PRN
  Administered 2013-07-26: .8 mg via INTRAVENOUS

## 2013-07-26 MED ORDER — DOCUSATE SODIUM 100 MG PO CAPS: 100.0000 mg | ORAL_CAPSULE | Freq: Two times a day (BID) | ORAL | Status: DC

## 2013-07-26 MED ORDER — PHENYLEPHRINE 40 MCG/ML (10ML) SYRINGE FOR IV PUSH (FOR BLOOD PRESSURE SUPPORT)
PREFILLED_SYRINGE | INTRAVENOUS | Status: AC
  Filled 2013-07-26: qty 10

## 2013-07-26 MED ORDER — PROMETHAZINE HCL 25 MG/ML IJ SOLN: 6.2500 mg | INTRAMUSCULAR | Status: DC | PRN

## 2013-07-26 MED ORDER — PROPOFOL 10 MG/ML IV BOLUS
INTRAVENOUS | Status: DC | PRN
  Administered 2013-07-26: 200 mg via INTRAVENOUS

## 2013-07-26 MED ORDER — DEXAMETHASONE SODIUM PHOSPHATE 10 MG/ML IJ SOLN
INTRAMUSCULAR | Status: AC
  Filled 2013-07-26: qty 1

## 2013-07-26 MED ORDER — SODIUM CHLORIDE 0.9 % IV SOLN: 250.0000 mL | INTRAVENOUS | Status: DC

## 2013-07-26 MED ORDER — ROCURONIUM BROMIDE 100 MG/10ML IV SOLN
INTRAVENOUS | Status: DC | PRN
  Administered 2013-07-26: 50 mg via INTRAVENOUS

## 2013-07-26 MED ORDER — HYDROMORPHONE HCL PF 1 MG/ML IJ SOLN
0.5000 mg | INTRAMUSCULAR | Status: DC | PRN
  Administered 2013-07-26: 0.5 mg via INTRAVENOUS
  Filled 2013-07-26: qty 1

## 2013-07-26 MED ORDER — GLYCOPYRROLATE 0.2 MG/ML IJ SOLN
INTRAMUSCULAR | Status: AC
  Filled 2013-07-26: qty 4

## 2013-07-26 MED ORDER — METHOCARBAMOL 500 MG PO TABS: 500.0000 mg | ORAL_TABLET | Freq: Three times a day (TID) | ORAL | Status: AC | PRN

## 2013-07-26 MED ORDER — THROMBIN 5000 UNITS EX SOLR
OROMUCOSAL | Status: DC | PRN
  Administered 2013-07-26 (×2): via TOPICAL

## 2013-07-26 MED ORDER — PHENOL 1.4 % MT LIQD: 1.0000 | OROMUCOSAL | Status: DC | PRN

## 2013-07-26 MED ORDER — DOCUSATE SODIUM 100 MG PO CAPS
100.0000 mg | ORAL_CAPSULE | Freq: Two times a day (BID) | ORAL | Status: DC
  Administered 2013-07-26 – 2013-07-27 (×2): 100 mg via ORAL

## 2013-07-26 MED ORDER — SODIUM CHLORIDE 0.45 % IV SOLN
INTRAVENOUS | Status: DC
  Administered 2013-07-26: 23:00:00 via INTRAVENOUS

## 2013-07-26 MED ORDER — OXYCODONE-ACETAMINOPHEN 5-325 MG PO TABS
1.0000 | ORAL_TABLET | ORAL | Status: DC | PRN
  Administered 2013-07-26 (×2): 1 via ORAL
  Administered 2013-07-27 (×3): 2 via ORAL
  Filled 2013-07-26: qty 2
  Filled 2013-07-26 (×2): qty 1
  Filled 2013-07-26 (×2): qty 2

## 2013-07-26 MED ORDER — HYDROCODONE-ACETAMINOPHEN 5-325 MG PO TABS: 1.0000 | ORAL_TABLET | ORAL | Status: DC | PRN

## 2013-07-26 MED ORDER — ONDANSETRON HCL 4 MG/2ML IJ SOLN
INTRAMUSCULAR | Status: AC
  Filled 2013-07-26: qty 2

## 2013-07-26 MED ORDER — ACETAMINOPHEN 650 MG RE SUPP: 650.0000 mg | RECTAL | Status: DC | PRN

## 2013-07-26 MED ORDER — GABAPENTIN 400 MG PO CAPS
800.0000 mg | ORAL_CAPSULE | Freq: Two times a day (BID) | ORAL | Status: DC
  Administered 2013-07-26 – 2013-07-27 (×2): 800 mg via ORAL
  Filled 2013-07-26 (×3): qty 2

## 2013-07-26 MED ORDER — ATENOLOL 25 MG PO TABS
25.0000 mg | ORAL_TABLET | Freq: Every day | ORAL | Status: DC
  Administered 2013-07-26: 25 mg via ORAL
  Filled 2013-07-26 (×2): qty 1

## 2013-07-26 MED ORDER — PHENYLEPHRINE HCL 10 MG/ML IJ SOLN
INTRAMUSCULAR | Status: DC | PRN
  Administered 2013-07-26: 40 ug via INTRAVENOUS

## 2013-07-26 MED ORDER — BUPIVACAINE-EPINEPHRINE 0.5% -1:200000 IJ SOLN
INTRAMUSCULAR | Status: DC | PRN
  Administered 2013-07-26: 2 mL

## 2013-07-26 SURGICAL SUPPLY — 48 items
BAG SPEC THK2 15X12 ZIP CLS (MISCELLANEOUS)
BAG ZIPLOCK 12X15 (MISCELLANEOUS) IMPLANT
CHLORAPREP W/TINT 26ML (MISCELLANEOUS) IMPLANT
CLEANER TIP ELECTROSURG 2X2 (MISCELLANEOUS) ×2 IMPLANT
CLOSURE STERI-STRIP 1/4X4 (GAUZE/BANDAGES/DRESSINGS) ×1 IMPLANT
CLOTH 2% CHLOROHEXIDINE 3PK (PERSONAL CARE ITEMS) ×2 IMPLANT
DECANTER SPIKE VIAL GLASS SM (MISCELLANEOUS) ×2 IMPLANT
DRAPE MICROSCOPE LEICA (MISCELLANEOUS) ×2 IMPLANT
DRAPE POUCH INSTRU U-SHP 10X18 (DRAPES) ×2 IMPLANT
DRAPE SURG 17X11 SM STRL (DRAPES) ×2 IMPLANT
DRAPE UTILITY XL STRL (DRAPES) ×2 IMPLANT
DRSG AQUACEL AG ADV 3.5X 4 (GAUZE/BANDAGES/DRESSINGS) IMPLANT
DRSG AQUACEL AG ADV 3.5X 6 (GAUZE/BANDAGES/DRESSINGS) ×1 IMPLANT
DURAPREP 26ML APPLICATOR (WOUND CARE) ×2 IMPLANT
DURASEAL SPINE SEALANT 3ML (MISCELLANEOUS) IMPLANT
ELECT BLADE TIP CTD 4 INCH (ELECTRODE) IMPLANT
ELECT REM PT RETURN 9FT ADLT (ELECTROSURGICAL) ×2
ELECTRODE REM PT RTRN 9FT ADLT (ELECTROSURGICAL) ×1 IMPLANT
GLOVE BIOGEL PI IND STRL 7.5 (GLOVE) ×1 IMPLANT
GLOVE BIOGEL PI INDICATOR 7.5 (GLOVE) ×1
GLOVE SURG SS PI 7.5 STRL IVOR (GLOVE) ×2 IMPLANT
GLOVE SURG SS PI 8.0 STRL IVOR (GLOVE) ×4 IMPLANT
GOWN STRL REIN XL XLG (GOWN DISPOSABLE) ×4 IMPLANT
IV CATH 14GX2 1/4 (CATHETERS) ×2 IMPLANT
KIT BASIN OR (CUSTOM PROCEDURE TRAY) ×2 IMPLANT
KIT POSITIONING SURG ANDREWS (MISCELLANEOUS) ×2 IMPLANT
MANIFOLD NEPTUNE II (INSTRUMENTS) ×2 IMPLANT
NDL SPNL 18GX3.5 QUINCKE PK (NEEDLE) ×2 IMPLANT
NEEDLE SPNL 18GX3.5 QUINCKE PK (NEEDLE) ×4 IMPLANT
PATTIES SURGICAL .5 X.5 (GAUZE/BANDAGES/DRESSINGS) IMPLANT
PATTIES SURGICAL .75X.75 (GAUZE/BANDAGES/DRESSINGS) IMPLANT
PATTIES SURGICAL 1X1 (DISPOSABLE) IMPLANT
SPONGE SURGIFOAM ABS GEL 100 (HEMOSTASIS) ×2 IMPLANT
STAPLER VISISTAT (STAPLE) IMPLANT
STRIP CLOSURE SKIN 1/2X4 (GAUZE/BANDAGES/DRESSINGS) ×2 IMPLANT
SUT NURALON 4 0 TR CR/8 (SUTURE) IMPLANT
SUT PROLENE 3 0 PS 2 (SUTURE) ×2 IMPLANT
SUT VIC AB 1 CT1 27 (SUTURE)
SUT VIC AB 1 CT1 27XBRD ANTBC (SUTURE) IMPLANT
SUT VIC AB 1-0 CT2 27 (SUTURE) ×2 IMPLANT
SUT VIC AB 2-0 CT1 27 (SUTURE)
SUT VIC AB 2-0 CT1 TAPERPNT 27 (SUTURE) IMPLANT
SUT VIC AB 2-0 CT2 27 (SUTURE) ×2 IMPLANT
SYR 3ML LL SCALE MARK (SYRINGE) ×2 IMPLANT
TOWEL OR 17X26 10 PK STRL BLUE (TOWEL DISPOSABLE) ×2 IMPLANT
TOWEL OR NON WOVEN STRL DISP B (DISPOSABLE) IMPLANT
TRAY LAMINECTOMY (CUSTOM PROCEDURE TRAY) ×2 IMPLANT
YANKAUER SUCT BULB TIP NO VENT (SUCTIONS) IMPLANT

## 2013-07-26 NOTE — Brief Op Note (Signed)
07/26/2013  3:19 PM  PATIENT:  Brianna Black  48 y.o. female  PRE-OPERATIVE DIAGNOSIS:  HNP L5-S1 RIGHT (RECURRENT)   POST-OPERATIVE DIAGNOSIS:  HNP L5-S1 RIGHT (RECURRENT)   PROCEDURE:  Procedure(s): REDO MICRO-LUMBAR DECOMPRESSION L5-S1 RIGHT  (Right)  SURGEON:  Surgeon(s) and Role:    * Javier DockerJeffrey C Sherell Christoffel, MD - Primary  PHYSICIAN ASSISTANT:   ASSISTANTS: Bissell   ANESTHESIA:   general  EBL:     BLOOD ADMINISTERED:none  DRAINS: none   LOCAL MEDICATIONS USED:  MARCAINE     SPECIMEN:  No Specimen  DISPOSITION OF SPECIMEN:  N/A  COUNTS:  YES  TOURNIQUET:  * No tourniquets in log *  DICTATION: .Other Dictation: Dictation Number [id number]  PLAN OF CARE: Admit for overnight observation  PATIENT DISPOSITION:  PACU - hemodynamically stable.   Delay start of Pharmacological VTE agent (>24hrs) due to surgical blood loss or risk of bleeding: yes

## 2013-07-26 NOTE — Anesthesia Postprocedure Evaluation (Signed)
Anesthesia Post Note  Patient: Donnie AhoLinda A Carducci  Procedure(s) Performed: Procedure(s) (LRB): REDO MICRO-LUMBAR DECOMPRESSION L5-S1 RIGHT  (Right)  Anesthesia type: General  Patient location: PACU  Post pain: Pain level controlled  Post assessment: Post-op Vital signs reviewed  Last Vitals: BP 117/60  Pulse 96  Temp(Src) 36.7 C (Oral)  Resp 13  Ht 5\' 4"  (1.626 m)  Wt 198 lb (89.812 kg)  BMI 33.97 kg/m2  SpO2 100%  Post vital signs: Reviewed  Level of consciousness: sedated  Complications: No apparent anesthesia complications

## 2013-07-26 NOTE — Interval H&P Note (Signed)
History and Physical Interval Note:  07/26/2013 3:04 PM  Brianna Black  has presented today for surgery, with the diagnosis of HNP L5-S1 RIGHT (RECURRENT)   The various methods of treatment have been discussed with the patient and family. After consideration of risks, benefits and other options for treatment, the patient has consented to  Procedure(s): REDO MICRO-LUMBAR DECOMPRESSION L5-S1 RIGHT  (Right) as a surgical intervention .  The patient's history has been reviewed, patient examined, no change in status, stable for surgery.  I have reviewed the patient's chart and labs.  Questions were answered to the patient's satisfaction.     Orvie Caradine C

## 2013-07-26 NOTE — Transfer of Care (Signed)
Immediate Anesthesia Transfer of Care Note  Patient: Brianna Black  Procedure(s) Performed: Procedure(s): REDO MICRO-LUMBAR DECOMPRESSION L5-S1 RIGHT  (Right)  Patient Location: PACU  Anesthesia Type:General  Level of Consciousness: awake, alert , oriented and patient cooperative  Airway & Oxygen Therapy: Patient Spontanous Breathing and Patient connected to face mask oxygen  Post-op Assessment: Report given to PACU RN, Post -op Vital signs reviewed and stable and Patient moving all extremities  Post vital signs: Reviewed and stable  Complications: No apparent anesthesia complications

## 2013-07-26 NOTE — Discharge Instructions (Signed)
Walk As Tolerated utilizing back precautions.  No bending, twisting, or lifting.  No driving for 2 weeks.   Aquacel dressing may remain in place for 7 days. May shower with aquacel dressing in place. After 7 days, remove aquacel dressing and place gauze and tape dressing which should be kept clean and dry and changed daily. Do not remove steri-strips if they are present. See Dr. Shelle IronBeane in office in 10 to 14 days. Begin taking aspirin 81mg  per day starting 4 days after your surgery if not allergic to aspirin or on another blood thinner. Walk daily even outside. Use a cane or walker only if necessary. Avoid sitting on soft sofas.  Epidural Steroid Injection An epidural steroid injection is given to relieve pain in your neck, back, or legs that is caused by the irritation or swelling of a nerve root. This procedure involves injecting a steroid and numbing medicine (anesthetic) into the epidural space. The epidural space is the space between the outer covering of your spinal cord and the bones that form your backbone (vertebra).  LET [facility] Center[facility]OUR HEALTH CARE PROVIDER KNOW ABOUT:   Any allergies you have.  All medicines you are taking, including vitamins, herbs, eye drops, creams, and over-the-counter medicines such as aspirin.  Previous problems you or members of your family have had with the use of anesthetics.  Any blood disorders or blood clotting disorders you have.  Previous surgeries you have had.  Medical conditions you have. RISKS AND COMPLICATIONS Generally, this is a safe procedure. However, as with any procedure, complications can occur. Possible complications of epidural steroid injection include:  Headache.  Bleeding.  Infection.  Allergic reaction to the medicines.  Damage to your nerves. The response to this procedure depends on the underlying cause of the pain and its duration. People who have long-term (chronic) pain are less likely to benefit from epidural steroids than are  those people whose pain comes on strong and suddenly. BEFORE THE PROCEDURE   Ask your health care provider about changing or stopping your regular medicines. You may be advised to stop taking blood-thinning medicines a few days before the procedure.  You may be given medicines to reduce anxiety.  Arrange for someone to take you home after the procedure. PROCEDURE   You will remain awake during the procedure. You may receive medicine to make you relaxed.  You will be asked to lie on your stomach.  The injection site will be cleaned.  The injection site will be numbed with a medicine (local anesthetic).  A needle will be injected through your skin into the epidural space.  Your health care provider will use an X-ray machine to ensure that the steroid is delivered closest to the affected nerve. You may have minimal discomfort at this time.  Once the needle is in the right position, the local anesthetic and the steroid will be injected into the epidural space.  The needle will then be removed and a bandage will be applied to the injection site. AFTER THE PROCEDURE   You may be monitored for a short time before you go home.  You may feel weakness or numbness in your arm or leg, which disappears within hours.  You may be allowed to eat, drink, and take your regular medicine.  You may have soreness at the site of the injection. Document Released: 10/15/2007 Document Revised: 03/10/2013 Document Reviewed: 12/25/2012 [facility] Patient Information 2014 [place], [place].

## 2013-07-26 NOTE — Preoperative (Addendum)
Beta Blockers   Reason not to administer Beta Blockers:Atenolol taken 07-25-13 at 2330

## 2013-07-26 NOTE — H&P (View-Only) (Signed)
Brianna Brianna Black A Brianna Black is an 48 y.o. female.   Chief Complaint: back and right leg pain HPI: The patient is a 48 year old female being followed for their low back symptoms. They are now 4 1/2 weeks out from a flare up. Symptoms reported today include: pain and leg pain. The patient states that they are doing poorly. Current treatment includes: home exercise program, relative rest, activity modification, NSAIDs and pain medications. The following medication has been used for pain control: antiinflammatory medication (diclofenac), Percocet and Neurontin. The patient reports their current pain level to be severe. The patient has not gotten any relief of their symptoms with activity modification, conservative measures or NSAIDs. Note for "Follow-up back": Brianna Brianna Black is c/o increased pain that started on 07/15/2013. She reports no change in activity, although she was shopping the day before for last-minute Christmas things. She denies any specific injury but woke up with worsening pain. She reports minimal back pain but severe R buttock, posterior leg pain to the lateral foot with numbness and tingling, weakness in the right leg especially the buttock. She did have prior lumbar decompression L5-S1 right in July 2009 by Dr. Shelle IronBeane. At last visit she was 70% better following S1 SNRB, reports the day after the injection was the best. She has tried to increase the Neurontin with no help. She is taking 2 or 3 percocet at a time for relief and having trouble sleeping. She was previously more comfortable standing but now even that is painful. She is using a cane to walk.  Past Medical History  Diagnosis Date  . Hypertension   . Hypercholesteremia     Past Surgical History  Procedure Laterality Date  . Back surgery    . Knee surgery    . Cesarean section      No family history on file. Social History:  reports that she quit smoking about 29 years ago. Her smoking use included Cigarettes. She smoked 0.00 packs per day. She  does not have any smokeless tobacco history on file. Her alcohol and drug histories are not on file.  Allergies:  Allergies  Allergen Reactions  . Ciprofloxacin Hcl     Stomach cramps  . Penicillins Rash    As a child     (Not in a hospital admission)  No results found for this or any previous visit (from the past 48 hour(s)). No results found.  Review of Systems  Constitutional: Negative.   HENT: Negative.   Eyes: Negative.   Respiratory: Negative.   Cardiovascular: Negative.   Gastrointestinal: Negative.   Genitourinary: Negative.   Musculoskeletal: Positive for back pain.  Skin: Negative.   Neurological: Positive for sensory change and focal weakness.    There were no vitals taken for this visit. Physical Exam  Constitutional: She is oriented to person, place, and time. She appears well-developed and well-nourished. She appears distressed.  HENT:  Head: Normocephalic and atraumatic.  Eyes: Conjunctivae and EOM are normal. Pupils are equal, round, and reactive to light.  Neck: Normal range of motion. Neck supple.  Cardiovascular: Normal rate and regular rhythm.   Respiratory: Effort normal and breath sounds normal.  GI: Soft. Bowel sounds are normal.  Musculoskeletal:  General Mental Status - Alert. General Appearance- pleasant and In acute distress. appears uncomfortable. Note: alternates between sitting and leaning left and standing, pacing Orientation- Oriented X3. Build & Nutrition- Overweight. Gait- Antalgic.  Abdomen Palpation/Percussion:Tenderness- Abdomen is non-tender to palpation. Rigidity (guarding)- Abdomen is soft.  Peripheral Vascular Lower Extremity:  Palpation:Homan's sign- Bilateral- Negative (normal). Posterior tibial pulse- Bilateral- 2+. Dorsalis pedis pulse- Bilateral- 2+.  Neurologic Motor: Strength :Hip Flexion- Left- 5/5. Right- 4-/5. Quadriceps- Bilateral- 5/5. Hamstrings- Bilateral- 5/5. Ankle Dorsiflexion-  Bilateral- 5/5. Ankle Plantarflexion- Bilateral- 5/5. Extensor Hallucis Longus- Left- 5/5. Right- 4/5. Sensation:Lower Extremity- Bilateral- sensation is intact to light touch in the lower extremity. Reflexes:Patellar Reflex- Bilateral- 2+. Achilles Reflex- Bilateral- 2+. Babinski- Bilateral- Babinski not present. Clonus- Bilateral- clonus not present.  Musculoskeletal Spine/Ribs/Pelvis Lumbosacral Spine:Inspection and Palpation- Tenderness- lumbar spinous processes tender to palpation, left lumbar paraspinals tender to palpation, right lumbar paraspinals tender to palpation, right buttock is tender to palpation and right greater trochanter is tender to palpation. no tenderness to palpation of the right flank, no tenderness to palpation of right flank and no tenderness to palpation of the left greater trochanter. Swelling- none. Surrounding tissue tension/texture is - soft. Sensation- normal. Other characteristics- no ecchymosis, no abnormal warmth, no erythema and no evidence of cellulitis. ROM- Flexion- moderately decreased range of motion. Extension- moderately decreased range of motion. Testing limited- due to pain. Special Testing- Lumbar- Right seated straight leg raise positive (produces buttock and leg pain). Left seated straight leg raise negative. Lower Extremity Right Lower Extremity: Right Hip: ROM:- Full ROM of the hip and - pain-free. Right Knee: ROM:- Full ROM of the knee and - pain-free. Right Ankle: ROM:- Full and pain-free ROM of the ankle. Left Lower Extremity: Left Hip : ROM:- Full ROM of the hip and - pain-free. Left Knee: ROM:- Full ROM of the knee and - pain-free. Left Ankle: ROM:- Full and pain-free ROM of the ankle.  Lymphatic General Lymphatics Description- No Localized lymphadenopathy.  Neurological: She is alert and oriented to person, place, and time. She has normal reflexes.  Skin: Skin is warm and dry.   Psychiatric: She has a normal mood and affect.     MRI Lspine again reviewed by Dr. Shelle Iron with small right sided HNP L5-S1 with epidural fibrosis displacing the S1 root.  Assessment/Plan Recurrent HNP L5-S1 right  Pt with ongoing, worsening RLE radiculopathy, dermatomal dysthesias, myotomal weakness, S1 distribution, due to recurrent HNP L5-S1 right, prior hx of microdiscectomy L5-S1 right in 2009, current flareup for approx 4.5 weeks. Refractory to NSAIDs, neurontin, pain medication, muscle relaxers, relative rest, activity modification, SNRB, HEP. Her symptoms have actually worsened, she is now walking with a cane. We discussed tx options. Given her limited relief with SNRB S1, would not recommend repeat SNRB. Given her ongoing and worsening symptoms, weakness, numbness, and duration of 4.5 weeks now, it is certainly reasonable to consider surgical intervention, redo microlumbar decompression L5-S1 right. We discussed surgery itself as well as risks, complications, and alternatives including but not limited to DVT, PE, infx, bleeding, failure of procedure, need for secondary procedure, anesthesia risk, dural tear, CSF leak, even death. Discussed post-op protocols, expected time out of work, restrictions/modifications post-op, need for PT. All her questions were answered and she desires to proceed. She does not feel she can continue to deal with her pain the way it is currently. In the interim, will increase strength of Percocet, continue Flexeril, Ibuprofen, Neurontin. She denies hx of DVT or MRSA. She is on multiple HTN meds but reports good control. Will ask her PCP, Dr. Hyman Hopes, for pre-op clearance prior to surgery. We will proceed accordingly. She will follow up 10-14 days post-op for suture removal.  I had an extensive discussion of the risks and benefits of the lumbar decompression with the patient including  bleeding, infection, damage to neurovascular structures, epidural fibrosis, CSF leak  requiring repair. We also discussed increase in pain, adjacent segment disease, recurrent disc herniation, need for future surgery including repeat decompression and/or fusion. We also discussed risks of postoperative hematoma, paralysis, anesthetic complications including DVT, PE, death, cardiopulmonary dysfunction. In addition, the perioperative and postoperative courses were discussed in detail including the rehabilitative time and return to functional activity and work. I provided the patient with an illustrated handout and utilized the appropriate surgical models.  Plan redo microlumbar decompression L5-S1 right  Shamarcus Hoheisel M. for Dr. Shelle Iron 07/21/2013, 5:04 PM

## 2013-07-26 NOTE — Anesthesia Preprocedure Evaluation (Addendum)
Anesthesia Evaluation  Patient identified by MRN, date of birth, ID band Patient awake    Reviewed: Allergy & Precautions, H&P , NPO status , Patient's Chart, lab work & pertinent test results  Airway Mallampati: II TM Distance: >3 FB Neck ROM: Full    Dental  (+) Dental Advisory Given   Pulmonary sleep apnea , pneumonia -, former smoker,  breath sounds clear to auscultation        Cardiovascular hypertension, negative cardio ROS  Rhythm:Regular Rate:Normal     Neuro/Psych  Headaches, PSYCHIATRIC DISORDERS Depression    GI/Hepatic Neg liver ROS, GERD-  ,  Endo/Other  negative endocrine ROS  Renal/GU negative Renal ROS     Musculoskeletal negative musculoskeletal ROS (+)   Abdominal   Peds  Hematology  (+) anemia ,   Anesthesia Other Findings   Reproductive/Obstetrics negative OB ROS                          Anesthesia Physical Anesthesia Plan  ASA: II  Anesthesia Plan: General   Post-op Pain Management:    Induction: Intravenous  Airway Management Planned: Oral ETT  Additional Equipment:   Intra-op Plan:   Post-operative Plan: Extubation in OR  Informed Consent: I have reviewed the patients History and Physical, chart, labs and discussed the procedure including the risks, benefits and alternatives for the proposed anesthesia with the patient or authorized representative who has indicated his/her understanding and acceptance.   Dental advisory given  Plan Discussed with: CRNA  Anesthesia Plan Comments:         Anesthesia Quick Evaluation

## 2013-07-27 ENCOUNTER — Encounter (HOSPITAL_COMMUNITY): Payer: Self-pay | Admitting: Specialist

## 2013-07-27 NOTE — Evaluation (Signed)
Occupational Therapy Evaluation Patient Details Name: Brianna AhoLinda A Vanburen MRN: [id number] DOB: 09/26/1965 Today's Date: 07/27/2013 Time: [id number] OT Time Calculation (min): 28 min  OT Assessment / Plan / Recommendation History of present illness REDO MICRO-LUMBAR DECOMPRESSION L5-S1 RIGHT  (Right   Clinical Impression   Family present and all education completed with pt and family. Practiced ADL and reviewed back care handout. Pt needs intermittant verbal cues for adhering to back precautions. Family aware.    OT Assessment  Patient does not need any further OT services    Follow Up Recommendations  No OT follow up;Supervision/Assistance - 24 hour    Barriers to Discharge      Equipment Recommendations  None recommended by OT    Recommendations for Other Services    Frequency       Precautions / Restrictions Precautions Precautions: Back Precaution Comments: Back care handout in room. Reviewed back precautions with pt and family.   Pertinent Vitals/Pain 7/10 back ; reposition, rest    ADL  Eating/Feeding: Independent Where Assessed - Eating/Feeding: Chair Grooming: Wash/dry hands;Min guard Where Assessed - Grooming: Unsupported standing Upper Body Bathing: Chest;Right arm;Left arm;Abdomen;Set up;Supervision/safety Where Assessed - Upper Body Bathing: Unsupported sitting Lower Body Bathing: Minimal assistance Where Assessed - Lower Body Bathing: Supported sit to stand Upper Body Dressing: Supervision/safety;Set up Where Assessed - Upper Body Dressing: Unsupported sitting Lower Body Dressing: Min guard (with reacher to don underwear and pants) Where Assessed - Lower Body Dressing: Supported sit to stand Toilet Transfer: LobbyistMin guard Toilet Transfer Equipment: Comfort height toilet;Grab bars Toileting - ArchitectClothing Manipulation and Hygiene: Moderate assistance (pt unable to reach posterior periarea without twisting.) Where Assessed - Toileting Clothing Manipulation and Hygiene:  Sit on 3-in-1 or toilet Equipment Used: Long-handled shoe horn;Long-handled sponge;Reacher;Rolling walker;Sock aid ADL Comments: Pt has a Sports administratorreacher and is familiar with use from previous back surgery and used it today to don clothing. She states she may still have her sock aid or she can have family help with this and she has slip on sheos. Discussed LHS option and where to obtain if intersted. Pt not able to clean posterior periarea without twisting so recommended toilet aid or tongs and pt vebalized understanding and states she can obtain one. Reviewed back care handout with pt and family. She needs intermittant verbal cues to adhere to precautions during ADL. Family states they can likely get a tub transfer bench for pt to sit and turn into tub.    OT Diagnosis:    OT Problem List:   OT Treatment Interventions:     OT Goals(Current goals can be found in the care plan section) Acute Rehab OT Goals Patient Stated Goal: home  Visit Information  Last OT Received On: 07/27/13 Assistance Needed: +1 History of Present Illness: REDO MICRO-LUMBAR DECOMPRESSION L5-S1 RIGHT  (Right       Prior Functioning     Home Living Family/patient expects to be discharged to:: Private residence Living Arrangements: Spouse/significant other;Children Available Help at Discharge: Family Home Equipment: Bedside commode;Tub bench;Adaptive equipment;Walker - 2 wheels Adaptive Equipment: Reacher Prior Function Level of Independence: Independent Communication Communication: No difficulties         Vision/Perception     Cognition  Cognition Arousal/Alertness: Awake/alert Behavior During Therapy: WFL for tasks assessed/performed Overall Cognitive Status: Within Functional Limits for tasks assessed    Extremity/Trunk Assessment Upper Extremity Assessment Upper Extremity Assessment: Overall WFL for tasks assessed     Mobility Transfers Transfers: Sit to Stand;Stand to Sit Sit to  Stand: 5:  Supervision;With upper extremity assist;From bed;From toilet Stand to Sit: 5: Supervision;With upper extremity assist;To bed;To chair/3-in-1 Details for Transfer Assistance: supervision for back precautions     Exercise     Balance Balance Balance Assessed: Yes Dynamic Standing Balance Dynamic Standing - Level of Assistance: 5: Stand by assistance   End of Session OT - End of Session Equipment Utilized During Treatment: Rolling walker Activity Tolerance: Patient tolerated treatment well Patient left: in chair;with call bell/phone within reach;with family/visitor present  GO Functional Assessment Tool Used: clinical judgement Functional Limitation: Self care Self Care Current Status (Z6109): At least 20 percent but less than 40 percent impaired, limited or restricted Self Care Goal Status (U0454): At least 20 percent but less than 40 percent impaired, limited or restricted Self Care Discharge Status [phone]): At least 20 percent but less than 40 percent impaired, limited or restricted   Lennox Laity [phone] 07/27/2013, 11:10 AM

## 2013-07-27 NOTE — Evaluation (Signed)
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  Patient will benefit from skilled PT to increase their independence and safety with mobility to allow discharge to the venue listed below.  Reviewed back precautions with pt, and pt ambulated and practiced stairs.  Pt's pain did increase with mobility to 9/10.  Pt plans to d/c home later today however if remains will see in acute care.     PT Assessment  Patient needs continued PT services    Follow Up Recommendations  Other (comment) (would benefit from outpatient f/u PT per surgeon)    Does the patient have the potential to tolerate intense rehabilitation      Barriers to Discharge        Equipment Recommendations  None recommended by PT    Recommendations for Other Services     Frequency Min 5X/week    Precautions / Restrictions Precautions Precautions: Back Precaution Comments: reviewed back precautions and provided handout   Pertinent Vitals/Pain Increased to 9/10 low back pain with mobility, premedicated, pt reports decrease in pain with rest      Mobility  Bed Mobility Bed Mobility: Rolling Left;Left Sidelying to Sit Rolling Left: 5: Supervision Left Sidelying to Sit: 5: Supervision;HOB elevated Details for Bed Mobility Assistance: verbal cue for complete roll, pt stated and performed log roll technique Transfers Transfers: Sit to Stand;Stand to Sit Sit to Stand: 5: Supervision;With upper extremity assist;From bed Stand to Sit: 5: Supervision;To toilet;With upper extremity assist Details for Transfer Assistance: provided cues for  LE placement to assist with maintaining back precautions Ambulation/Gait Ambulation/Gait Assistance: 4: Min guard;5: Supervision Ambulation Distance (Feet): 400 Feet Assistive device: Rolling walker Ambulation/Gait Assistance Details: pt with increased pain with ambulation and stairs states 9/10 low back pain however states improves with return to bed, utilized RW for suppport Gait Pattern: Step-through pattern Gait velocity: decr Stairs: Yes Stairs Assistance: 4: Min guard Stairs Assistance Details (indicate cue type and reason): verbal cues for sequence and safe technique, also educated spouse "up with the good, down with bad" Stair Management Technique: One rail Left;Forwards;Step to pattern Number of Stairs: 5 (2 and then 3 performed twice)    Exercises     PT Diagnosis: Difficulty walking;Acute pain  PT Problem List: Decreased strength;Decreased mobility;Pain PT Treatment Interventions: DME instruction;Gait training;Stair training;Functional mobility training;Therapeutic activities;Patient/family education     PT Goals(Current goals can be found in the care plan section) Acute Rehab PT Goals Patient Stated Goal: home PT Goal Formulation: With patient Time For Goal Achievement: 07/30/13 Potential to Achieve Goals: Good  Visit Information  Last PT Received On: 07/27/13 Assistance Needed: +1 History of Present Illness: 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        Prior Functioning  Home Living Family/patient expects to be discharged to:: Private residence Living Arrangements: Spouse/significant other;Children Available Help at Discharge: Family Type of Home: House Home Access: Stairs to enter Secretary/administrator of Steps: 3-4 Entrance Stairs-Rails: Left Home Layout: One level Home Equipment: Bedside commode;Tub bench;Adaptive equipment;Walker - 2 wheels Adaptive Equipment: Reacher Additional Comments: spouse states he has access to RW Prior  Function Level of Independence: Independent Communication  Communication: No difficulties    Cognition  Cognition Arousal/Alertness: Awake/alert Behavior During Therapy: WFL for tasks assessed/performed Overall Cognitive Status: Within Functional Limits for tasks assessed    Extremity/Trunk Assessment Upper Extremity Assessment Upper Extremity Assessment: Overall WFL for tasks assessed Lower Extremity Assessment Lower Extremity Assessment: RLE deficits/detail RLE Deficits / Details: pt unable to lift fully against gravity, grossly 2+/5 throughout, states R LE has been weaker, also states improvement in pain in leg post surgery, able to PF/DF full range   Balance Balance Balance Assessed: Yes Dynamic Standing Balance Dynamic Standing - Level of Assistance: 5: Stand by assistance  End of Session PT - End of Session Activity Tolerance: Patient limited by pain Patient left: Other (comment) (on toilet with family in room to assist)  GP Functional Assessment Tool Used: clinical judgement Functional Limitation: Mobility: Walking and moving around Mobility: Walking and Moving Around Current Status [phone](G8978): At least 1 percent but less than 20 percent impaired, limited or restricted Mobility: Walking and Moving Around Goal Status [phone](G8979): 0 percent impaired, limited or restricted   Brianna Black,Brianna Black 07/27/2013, 11:42 AM [name], PT, DPT 07/27/2013 Pager: [phone]

## 2013-07-27 NOTE — Progress Notes (Signed)
Subjective: 1 Day Post-Op Procedure(s) (LRB): REDO MICRO-LUMBAR DECOMPRESSION L5-S1 RIGHT  (Right) Patient reports pain as mild.  Reports mild incisional back pain. Her shooting leg pain is much better, still noting some numbness and tingling in the right leg. Voiding without difficulty.  Objective: Vital signs in last 24 hours: Temp:  [97.6 F (36.4 C)-99 F (37.2 C)] 98.1 F (36.7 C) (01/06 0548) Pulse Rate:  [75-114] 94 (01/06 0548) Resp:  [13-18] 16 (01/06 0548) BP: (111-143)/(49-77) 131/77 mmHg (01/06 0548) SpO2:  [91 %-100 %] 95 % (01/06 0548) Weight:  [89.812 kg (198 lb)] 89.812 kg (198 lb) (01/05 1716)  Intake/Output from previous day: 01/05 0701 - 01/06 0700 In: 3181.3 [I.V.:3131.3; IV Piggyback:50] Out: 625 [Urine:600; Blood:25] Intake/Output this shift:    No results found for this basename: HGB,  in the last 72 hours No results found for this basename: WBC, RBC, HCT, PLT,  in the last 72 hours No results found for this basename: NA, K, CL, CO2, BUN, CREATININE, GLUCOSE, CALCIUM,  in the last 72 hours No results found for this basename: LABPT, INR,  in the last 72 hours  Neurologically intact ABD soft Neurovascular intact Sensation intact distally Intact pulses distally Dorsiflexion/Plantar flexion intact Incision: dressing C/D/I and no drainage No cellulitis present Compartment soft no calf pain or sign of DVT  Assessment/Plan: 1 Day Post-Op Procedure(s) (LRB): REDO MICRO-LUMBAR DECOMPRESSION L5-S1 RIGHT  (Right) Advance diet Up with therapy D/C IV fluids Plan for PT today D/C after PT as long as pain remains well controlled Discussed Lspine precautions, dressing and D/C instructions Will discuss with Dr. Shelle IronBeane Follow up 10-14 days for suture removal  BISSELL, JACLYN M. 07/27/2013, 7:33 AM

## 2013-07-27 NOTE — Op Note (Signed)
NAMMarcelene Black:  Brianna, Black              ACCOUNT NO.:  [id number]  MEDICAL RECORD NO.:  [id number]  LOCATION:  1620                         [facility]  PHYSICIAN:  Jene EveryJeffrey Makynlee Kressin, M.D.    DATE OF BIRTH:  1966-07-22  DATE OF PROCEDURE:  07/26/2013 DATE OF DISCHARGE:                              OPERATIVE REPORT   PREOPERATIVE DIAGNOSES:  Recurrent disk herniation, disk degeneration with spinal stenosis L5-S1.  POSTOPERATIVE DIAGNOSES:  Recurrent disk herniation, disk degeneration with spinal stenosis L5-S1.  PROCEDURE PERFORMED: 1. Redo lumbar decompression, L5-S1 right. 2. Foraminotomy, L5-S1 right. 3. Microdiskectomy, L5 on right. 4. Lysis of epidural venous plexus.  ANESTHESIA:  General.  ASSISTANT:  Lanna PocheJacqueline Bissell, PA.  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  Despite rest, activity modification therapy, neurologic deficit, neurotension signs, she was indicated for redo decompression.  She had no back pain.  Therefore, discussed redo decompression foraminotomies.  Risk and benefits were discussed including bleeding, infection, damage to neurovascular structures, DVT, PE, anesthetic complications, etc.  TECHNIQUE:  The patient in supine beach-chair position.  After induction of adequate general anesthesia, 2 g Kefzol, placed prone on the [place] frame.  All bony prominences well padded.  Lumbar region was prepped and draped in usual sterile fashion.  Two 18-gauge spinal needles utilized to localize L5-S1 interspace, confirmed with x-ray.  Incision was then made from spinous process L5 to S1.  Subcutaneous tissues were dissected.  Electrocautery was utilized to achieve hemostasis.  Fascia lata identified and divided in line with the skin incision.  Paraspinous muscle elevated.  McCullough retractor was placed.  Confirmatory radiograph  obtained.  Operating microscope was draped, brought on the surgical field.  The patient had a fairly small interlaminar window with extensive fibrosis.  Utilized a combination of an osteotome and a 2-mm Kerrison and pituitary to enlarge the laminotomy on the right.  Partial medial hemi-facetectomy was performed.  Approximately one-third of the medial aspect of the facet was removed with combination of the above instruments.  Following this in the safe zone, we identified the foramen of S1, performed a generous foraminotomy, it was fairly stenotic.  I then performed a generous foraminotomy of L5, that was stenotic as well. Identified the L5 nerve root to the shoulder and the axilla of the root. Another confirmatory radiograph obtained with the instrument set at this level, there were significant adhesions noted at the lateral aspect of the thecal sac in the S1 nerve root.  We gently developed a plane between the 2 and utilized a combination of Epstein upbiting pituitaries, and nerve hooks to meticulously mobilize large fragment in the sub-posterior longitudinal ligament space.  There was some osteophytes noted, that were removed as well.  This was removed and again further mobilized for that purpose.  Following the full diskectomy of herniated material and mobilization of the S1 and L5 nerve root, I felt we obtained an appropriate decompression at 1 cm excursion of the S1 nerve root medial pedicle without tension as well as the appropriate decompression of L5 root.  Disk space copiously irrigated with a catheter lavage antibiotic  irrigation.  Laminotomy defect repaired with the same.  No active bleeding or CSF leakage.  We placed thrombin-soaked Gelfoam over the laminotomy defect, removed McCullough retractor, irrigated, no active bleeding.  Closed the dorsolumbar fascia with 1 Vicryl, subcu with 2-0, and skin with Prolene.  Sterile dressing was applied.  Placed supine on hospital bed,  extubated without difficulty, and transported to the recovery room in satisfactory condition.  The patient tolerated the procedure well.  No complications.  Minimal blood loss.  INTRAOPERATIVE FINDINGS:  She had significant adhesions noted to the lateral aspect of the S1 nerve root.  It was erythematous and edematous, but intact following the decompression.     Jene Every, M.D.     Cordelia Pen  D:  07/26/2013  T:  07/27/2013  Job:  [id number]

## 2013-07-27 NOTE — Discharge Summary (Signed)
Physician Discharge Summary   Patient ID: Brianna Black MRN: [id number] DOB/AGE: December 22, 1965 48 y.o.  Admit date: 07/26/2013 Discharge date: 07/27/2013  Primary Diagnosis:   HNP L5-S1 RIGHT (RECURRENT)   Admission Diagnoses:  Past Medical History  Diagnosis Date  . Hypertension   . Hypercholesteremia   . Headache(784.0)     migraines  . Depression     hx of  . Sleep apnea     doesnt use CPAP  . Pneumonia 2007    hx of  . GERD (gastroesophageal reflux disease)     occasional  . Anemia   . Herniated nucleus pulposus, L5-S1, right    Discharge Diagnoses:   Principal Problem:   HNP (herniated nucleus pulposus), lumbar Active Problems:   Lumbar stenosis with neurogenic claudication  Procedure:  Procedure(s) (LRB): REDO MICRO-LUMBAR DECOMPRESSION L5-S1 RIGHT  (Right)   Consults: None  HPI:  see H&P    Laboratory Data: Hospital Outpatient Visit on 07/23/2013  Component Date Value Range Status  . Preg, Serum 07/23/2013 NEGATIVE  NEGATIVE Final   Comment:                                 THE SENSITIVITY OF THIS                          METHODOLOGY IS >10 mIU/mL.  . MRSA, PCR 07/23/2013 NEGATIVE  NEGATIVE Final  . Staphylococcus aureus 07/23/2013 NEGATIVE  NEGATIVE Final   Comment:                                 The Xpert SA Assay (FDA                          approved for NASAL specimens                          in patients over 43 years of age),                          is one component of                          a comprehensive surveillance                          program.  Test performance has                          been validated by [employer] for patients greater                          than or equal to 33 year old.                          It is not intended                          to  diagnose infection nor to                          guide or monitor treatment.  . Sodium 07/23/2013 137  137 - 147 mEq/L Final   Please  note change in reference range.  . Potassium 07/23/2013 3.7  3.7 - 5.3 mEq/L Final   Please note change in reference range.  . Chloride 07/23/2013 98  96 - 112 mEq/L Final  . CO2 07/23/2013 27  19 - 32 mEq/L Final  . Glucose, Bld 07/23/2013 83  70 - 99 mg/dL Final  . BUN 07/23/2013 20  6 - 23 mg/dL Final  . Creatinine, Ser 07/23/2013 0.65  0.50 - 1.10 mg/dL Final  . Calcium 07/23/2013 9.5  8.4 - 10.5 mg/dL Final  . GFR calc non Af Amer 07/23/2013 >90  >90 mL/min Final  . GFR calc Af Amer 07/23/2013 >90  >90 mL/min Final   Comment: (NOTE)                          The eGFR has been calculated using the CKD EPI equation.                          This calculation has not been validated in all clinical situations.                          eGFR's persistently <90 mL/min signify possible Chronic Kidney                          Disease.  . WBC 07/23/2013 7.9  4.0 - 10.5 K/uL Final  . RBC 07/23/2013 4.23  3.87 - 5.11 MIL/uL Final  . Hemoglobin 07/23/2013 12.7  12.0 - 15.0 g/dL Final  . HCT 07/23/2013 37.6  36.0 - 46.0 % Final  . MCV 07/23/2013 88.9  78.0 - 100.0 fL Final  . MCH 07/23/2013 30.0  26.0 - 34.0 pg Final  . MCHC 07/23/2013 33.8  30.0 - 36.0 g/dL Final  . RDW 07/23/2013 13.3  11.5 - 15.5 % Final  . Platelets 07/23/2013 296  150 - 400 K/uL Final   No results found for this basename: HGB,  in the last 72 hours No results found for this basename: WBC, RBC, HCT, PLT,  in the last 72 hours No results found for this basename: NA, K, CL, CO2, BUN, CREATININE, GLUCOSE, CALCIUM,  in the last 72 hours No results found for this basename: LABPT, INR,  in the last 72 hours  X-Rays:Dg Chest 2 View  07/23/2013   CLINICAL DATA:  Pre operative respiratory exam. Lumbar disc disease.  EXAM: CHEST  2 VIEW  COMPARISON:  01/20/2008  FINDINGS: The heart size and mediastinal contours are within normal limits. Both lungs are clear. The visualized skeletal structures are unremarkable.  IMPRESSION: Normal  chest.   Electronically Signed   By: Rozetta Nunnery M.D.   On: 07/23/2013 09:52   Dg Lumbar Spine 2-3 Views  07/23/2013   CLINICAL DATA:  Spinal stenosis.  EXAM: LUMBAR SPINE - 2-3 VIEW  COMPARISON:  Radiographs dated 01/20/2008  FINDINGS: There are 5 typical lumbar segments. There is slight disc space narrowing at L4-5 and L5-S1, slightly progressed since the prior exam.  IMPRESSION: Degenerative disc disease at L4-5 and L5-S1.   Electronically Signed  By: Rozetta Nunnery M.D.   On: 07/23/2013 09:50   Dg Spine Portable 1 View  07/26/2013   CLINICAL DATA:  Decompression L5-S1  EXAM: PORTABLE SPINE - 1 VIEW  COMPARISON:  07/26/2013  FINDINGS: Single lateral view demonstrates surgical markers along the posterior aspect of L5-S1. Normal alignment of the lumbar spine.  IMPRESSION: Surgical marking at L5-S1.   Electronically Signed   By: Markus Daft M.D.   On: 07/26/2013 17:00   Dg Spine Portable 1 View  07/26/2013   CLINICAL DATA:  Micro lumbar decompression  EXAM: PORTABLE SPINE - 1 VIEW  COMPARISON:  Film from earlier in the same day  FINDINGS: Five lumbar type vertebral bodies are again seen. The numbering nomenclature similar to that used on the prior exam. Disc space narrowing remains at L4-5 and L5-S1. Surgical instruments are noted at the L5-S1 level consistent with the given clinical history.   Electronically Signed   By: Inez Catalina M.D.   On: 07/26/2013 16:06   Dg Spine Portable 1 View  07/26/2013   CLINICAL DATA:  Lumbar decompression  EXAM: PORTABLE SPINE - 1 VIEW  COMPARISON:  07/23/2013  FINDINGS: Numbering nomenclature is similar to that used on the prior preoperative examination. Needles are noted within the posterior soft tissues at the L5 and S1 level. Disc space narrowing is noted at L4-5 and L5-S1.   Electronically Signed   By: Inez Catalina M.D.   On: 07/26/2013 16:06   Dg Epidural/nerve Root  07/01/2013   CLINICAL DATA:  Lumbosacral spondylosis without myelopathy. Right S1 radiculopathy with  L5-S1 disc degeneration. Specific request for right S1 nerve root block and epidural steroid injection.  EXAM: TRANSFORAMINAL S1 EPIDURAL INJECTION AND SELECTIVE NERVE ROOT BLOCK  FLUOROSCOPY TIME:  1 min 7 seconds  PROCEDURE: After a thorough discussion of risks and benefits of the procedure, written and verbal consent was obtained. Specific risks included puncture of the thecal sac and dura, injury to nerve root, worsening back pain as well as nontherapeutic injection with general risks of bleeding, infection, injury to nerves, blood vessels, and adjacent structures. Verbal consent was obtained by Dr. Gerilyn Pilgrim.  Transforaminal approach was performed on the Right at S1. The overlying skin was cleansed with betadine soap and anesthetized with 1% lidocaine without epinephrine. A 3-1/2 inch 22 gauge spinal needle was positioned in the posterior Right S1 neural foramen via posterior approach.  DIAGNOSTIC EPIDURAL INJECTION  Injection of Omnipaque 180 shows a good epidural pattern following the nerve root on the right. No vascular opacification is seen.  THERAPEUTIC EPIDURAL INJECTION  120 mg of Depo-Medrol mixed with 5 cc of 1% Lidocaine were instilled. The procedure was well-tolerated, and the patient was discharged thirty minutes following the injection in good condition.  IMPRESSION: Technically successful 1st right S1 injection.   Electronically Signed   By: Dereck Ligas M.D.   On: 07/01/2013 13:06    EKG: Orders placed during the hospital encounter of 07/23/13  . EKG 12-LEAD  . EKG 12-LEAD     Hospital Course: Patient was admitted to [facility] and taken to the OR and underwent the above state procedure without complications.  Patient tolerated the procedure well and was later transferred to the recovery room and then to the orthopaedic floor for postoperative care.  They were given PO and IV analgesics for pain control following their surgery.  They were given 24 hours of postoperative  antibiotics.   PT was consulted postop to assist with  mobility and transfers.  The patient was allowed to be WBAT with therapy and was taught back precautions. Discharge planning was consulted to help with postop disposition and equipment needs.  Patient had a good night on the evening of surgery and started to get up OOB with therapy on day one. Patient was seen in rounds and was ready to go home on day one, pain well controlled.  They were given discharge instructions and dressing directions.  They were instructed on when to follow up in the office with Dr. Tonita Cong.  Discharge Medications: Prior to Admission medications   Medication Sig Start Date End Date Taking? Authorizing Provider  atenolol (TENORMIN) 25 MG tablet Take 25 mg by mouth at bedtime.   Yes Historical Provider, MD  cyclobenzaprine (FLEXERIL) 5 MG tablet Take 5-10 mg by mouth 3 (three) times daily as needed for muscle spasms.   Yes Historical Provider, MD  ferrous sulfate 325 (65 FE) MG tablet Take 325 mg by mouth daily with breakfast.   Yes Historical Provider, MD  fluticasone (FLONASE) 50 MCG/ACT nasal spray Place 1 spray into both nostrils daily as needed for allergies or rhinitis.   Yes Historical Provider, MD  gabapentin (NEURONTIN) 400 MG capsule Take 800 mg by mouth 2 (two) times daily.    Yes Historical Provider, MD  hydrochlorothiazide (MICROZIDE) 12.5 MG capsule Take 12.5 mg by mouth every morning.   Yes Historical Provider, MD  HYDROcodone-acetaminophen (NORCO) 10-325 MG per tablet Take 1 tablet by mouth every 6 (six) hours as needed.   Yes Historical Provider, MD  ibuprofen (ADVIL,MOTRIN) 200 MG tablet Take 800 mg by mouth every 6 (six) hours as needed (pain).   Yes Historical Provider, MD  Multiple Vitamin (MULTIVITAMIN WITH MINERALS) TABS tablet Take 1 tablet by mouth daily.   Yes Historical Provider, MD  norethindrone (CAMILA) 0.35 MG tablet Take 1 tablet by mouth every morning.   Yes Historical Provider, MD  OVER THE  COUNTER MEDICATION 1 tablet 2 (two) times daily as needed (for constipation).   Yes Historical Provider, MD  simvastatin (ZOCOR) 20 MG tablet Take 20 mg by mouth at bedtime.   Yes Historical Provider, MD  docusate sodium (COLACE) 100 MG capsule Take 1 capsule (100 mg total) by mouth 2 (two) times daily. 07/26/13   Johnn Hai, MD  methocarbamol (ROBAXIN) 500 MG tablet Take 1 tablet (500 mg total) by mouth 3 (three) times daily between meals as needed for muscle spasms. 07/26/13   Johnn Hai, MD  oxyCODONE-acetaminophen (PERCOCET) 7.5-325 MG per tablet Take 1-2 tablets by mouth every 4 (four) hours as needed for pain. 07/26/13   Johnn Hai, MD    Diet: low sodium heart healthy Activity:WBAT; Lspine precautions Follow-up:in 10-14 days Disposition - Home Discharged Condition: good   Discharge Orders   Future Orders Complete By Expires   Call MD / Call 911  As directed    Comments:     If you experience chest pain or shortness of breath, CALL 911 and be transported to the hospital emergency room.  If you develope a fever above 101 F, pus (white drainage) or increased drainage or redness at the wound, or calf pain, call your surgeon's office.   Constipation Prevention  As directed    Comments:     Drink plenty of fluids.  Prune juice may be helpful.  You may use a stool softener, such as Colace (over the counter) 100 mg twice a day.  Use MiraLax (over  the counter) for constipation as needed.   Diet - low sodium heart healthy  As directed    Increase activity slowly as tolerated  As directed        Medication List    STOP taking these medications       cyclobenzaprine 5 MG tablet  Commonly known as:  FLEXERIL     HYDROcodone-acetaminophen 10-325 MG per tablet  Commonly known as:  NORCO      TAKE these medications       atenolol 25 MG tablet  Commonly known as:  TENORMIN  Take 25 mg by mouth at bedtime.     CAMILA 0.35 MG tablet  Generic drug:  norethindrone  Take 1  tablet by mouth every morning.     docusate sodium 100 MG capsule  Commonly known as:  COLACE  Take 1 capsule (100 mg total) by mouth 2 (two) times daily.     ferrous sulfate 325 (65 FE) MG tablet  Take 325 mg by mouth daily with breakfast.     fluticasone 50 MCG/ACT nasal spray  Commonly known as:  FLONASE  Place 1 spray into both nostrils daily as needed for allergies or rhinitis.     gabapentin 400 MG capsule  Commonly known as:  NEURONTIN  Take 800 mg by mouth 2 (two) times daily.     hydrochlorothiazide 12.5 MG capsule  Commonly known as:  MICROZIDE  Take 12.5 mg by mouth every morning.     ibuprofen 200 MG tablet  Commonly known as:  ADVIL,MOTRIN  Take 800 mg by mouth every 6 (six) hours as needed (pain).     methocarbamol 500 MG tablet  Commonly known as:  ROBAXIN  Take 1 tablet (500 mg total) by mouth 3 (three) times daily between meals as needed for muscle spasms.     multivitamin with minerals Tabs tablet  Take 1 tablet by mouth daily.     OVER THE COUNTER MEDICATION  1 tablet 2 (two) times daily as needed (for constipation).     oxyCODONE-acetaminophen 7.5-325 MG per tablet  Commonly known as:  PERCOCET  Take 1-2 tablets by mouth every 4 (four) hours as needed for pain.     simvastatin 20 MG tablet  Commonly known as:  ZOCOR  Take 20 mg by mouth at bedtime.           Follow-up Information   Follow up with BEANE,JEFFREY C, MD In 2 weeks.   Specialty:  Orthopedic Surgery   Contact information:   [address] [place] [address] [phone]       Signed: Cecilie Kicks. 07/27/2013, 7:36 AM

## 2013-11-23 ENCOUNTER — Other Ambulatory Visit: Payer: Self-pay | Admitting: Family Medicine

## 2013-11-23 ENCOUNTER — Other Ambulatory Visit (HOSPITAL_COMMUNITY)
Admission: RE | Admit: 2013-11-23 | Discharge: 2013-11-23 | Disposition: A | Discharge status: Home / Self Care | Payer: BC Managed Care – PPO | Source: Ambulatory Visit | Attending: Family Medicine | Admitting: Family Medicine

## 2013-11-23 DIAGNOSIS — Z124 Encounter for screening for malignant neoplasm of cervix: Secondary | ICD-10-CM | POA: Insufficient documentation

## 2013-11-23 DIAGNOSIS — Z1151 Encounter for screening for human papillomavirus (HPV): Secondary | ICD-10-CM | POA: Insufficient documentation

## 2013-12-10 ENCOUNTER — Ambulatory Visit: Payer: BC Managed Care – PPO

## 2013-12-10 ENCOUNTER — Ambulatory Visit (INDEPENDENT_AMBULATORY_CARE_PROVIDER_SITE_OTHER): Payer: BC Managed Care – PPO | Admitting: Internal Medicine

## 2013-12-10 VITALS — BP 132/104 | HR 98 | Temp 98.2°F | Resp 16 | Ht 63.5 in | Wt 204.6 lb

## 2013-12-10 DIAGNOSIS — M25532 Pain in left wrist: Principal | ICD-10-CM

## 2013-12-10 DIAGNOSIS — M25539 Pain in unspecified wrist: Secondary | ICD-10-CM

## 2013-12-10 DIAGNOSIS — S8000XA Contusion of unspecified knee, initial encounter: Secondary | ICD-10-CM

## 2013-12-10 DIAGNOSIS — M25531 Pain in right wrist: Secondary | ICD-10-CM

## 2013-12-10 NOTE — Progress Notes (Signed)
Subjective:    Patient ID: Brianna Black, female    DOB: 10/28/1965, 48 y.o.   MRN: [id number] This chart was scribed for Ellamae Siaobert Doolittle, Black by Valera CastleSteven Perry, ED Scribe. This patient was seen in room 04 and the patient's care was started at 3:20 PM.  Chief Complaint  Patient presents with  . Fall    knee pain, wrist pain, hands abrasion   HPI Brianna AhoLinda A Black is a 48 y.o. female Pt presents with constant, left knee pain and constant, bilateral wrist pain, onset earlier today after tripping over her shoelace and landing forward. She reports landing on her arms and knees. She has some abrasions over her hands. She reports movement of her wrists and knee exacerbates her pain. She is able to bear weight on her knee, but states that bearing weight exacerbates her pain the most. She denies hitting her head, LOC, neck pain.  PCP - Brianna Black  Patient Active Problem List   Diagnosis Date Noted  . HNP (herniated nucleus pulposus), lumbar 07/26/2013  . Lumbar stenosis with neurogenic claudication 07/26/2013   Prior to Admission medications   Medication Sig Start Date End Date Taking? Authorizing Provider  atenolol (TENORMIN) 25 MG tablet Take 25 mg by mouth at bedtime.   Yes Historical Provider, Black  fluticasone (FLONASE) 50 MCG/ACT nasal spray Place 1 spray into both nostrils daily as needed for allergies or rhinitis.   Yes Historical Provider, Black  gabapentin (NEURONTIN) 400 MG capsule Take 800 mg by mouth 2 (two) times daily.    Yes Historical Provider, Black  hydrochlorothiazide (MICROZIDE) 12.5 MG capsule Take 12.5 mg by mouth every morning.   Yes Historical Provider, Black  ibuprofen (ADVIL,MOTRIN) 200 MG tablet Take 800 mg by mouth every 6 (six) hours as needed (pain).   Yes Historical Provider, Black  methocarbamol (ROBAXIN) 500 MG tablet Take 1 tablet (500 mg total) by mouth 3 (three) times daily between meals as needed for muscle spasms. 07/26/13  Yes Javier DockerJeffrey C Beane, Black  Multiple  Vitamin (MULTIVITAMIN WITH MINERALS) TABS tablet Take 1 tablet by mouth daily.   Yes Historical Provider, Black  norethindrone (CAMILA) 0.35 MG tablet Take 1 tablet by mouth every morning.   Yes Historical Provider, Black  oxyCODONE-acetaminophen (PERCOCET) 7.5-325 MG per tablet Take 1-2 tablets by mouth every 4 (four) hours as needed for pain. 07/26/13  Yes Javier DockerJeffrey C Beane, Black  simvastatin (ZOCOR) 20 MG tablet Take 20 mg by mouth at bedtime.   Yes Historical Provider, Black   Review of Systems  Musculoskeletal: Positive for arthralgias (left knee, bilateral wrists). Negative for neck pain.  Skin: Positive for wound (abrasions over bilateral hands).  Neurological: Negative for syncope and headaches.      Objective:   Physical Exam  Nursing note and vitals reviewed. Constitutional: She is oriented to person, place, and time. She appears well-developed and well-nourished. No distress.  HENT:  Head: Normocephalic and atraumatic.  Eyes: EOM are normal.  Neck: Neck supple.  Cardiovascular: Normal rate.   Pulmonary/Chest: Effort normal. No respiratory distress.  Musculoskeletal: Normal range of motion. She exhibits edema and tenderness.  Both wrists are tender in ulnar area and pain is made worse with both flexion and extension. Minimal swelling. Abrasions are superficial on both palms to the point of bleeding. Left knee has an abrasion mid patella with pain on patellar ballotment.   Neurological: She is alert and oriented to person, place, and time.  Skin: Skin is  warm and dry.  Psychiatric: She has a normal mood and affect. Her behavior is normal.   BP 132/104  Pulse 98  Temp(Src) 98.2 F (36.8 C) (Oral)  Resp 16  Ht 5' 3.5" (1.613 m)  Wt 204 lb 9.6 oz (92.806 kg)  BMI 35.67 kg/m2  SpO2 99%  LMP 12/03/2013  [facility] reading (PRIMARY) by Dr. Merla Riches: Right wrist: Negative; Left wrist: Negative; Left knee: Negative.      Assessment & Plan:  Pain in both wrists - both wrists have sprain  injuries that are placed in thumb spica splints Contusion, knee -keep Clean area of abrasion/advance walking as tolerated Abrasions palms-these are dressed after cleaning She has medicines at home to treat her discomfort/follow up in 2 weeks if still has wrist pain      I have completed the patient encounter in its entirety as documented by the scribe, with editing by me where necessary. Robert P. Merla Riches, M.D.

## 2013-12-17 ENCOUNTER — Ambulatory Visit (INDEPENDENT_AMBULATORY_CARE_PROVIDER_SITE_OTHER): Payer: BC Managed Care – PPO | Admitting: Family Medicine

## 2013-12-17 VITALS — BP 118/78 | HR 70 | Temp 98.8°F | Resp 16 | Ht 63.5 in | Wt 203.2 lb

## 2013-12-17 DIAGNOSIS — M79609 Pain in unspecified limb: Secondary | ICD-10-CM

## 2013-12-17 DIAGNOSIS — S63502A Unspecified sprain of left wrist, initial encounter: Secondary | ICD-10-CM

## 2013-12-17 DIAGNOSIS — S8000XA Contusion of unspecified knee, initial encounter: Secondary | ICD-10-CM

## 2013-12-17 DIAGNOSIS — S8002XA Contusion of left knee, initial encounter: Secondary | ICD-10-CM

## 2013-12-17 DIAGNOSIS — W19XXXA Unspecified fall, initial encounter: Secondary | ICD-10-CM

## 2013-12-17 MED ORDER — PREDNISONE 20 MG PO TABS: ORAL_TABLET | ORAL | Status: AC

## 2013-12-17 NOTE — Progress Notes (Signed)
This chart was scribed for Johnn Hai, MD by Joaquin Music, ED Scribe. This patient was seen in room Room/bed 2 and the patient's care was started at 9:30 AM. Subjective:    Patient ID: Brianna Black, female    DOB: [birth year], 48 y.o.   MRN: [id number] Chief Complaint  Patient presents with   Follow-up    Fall last Friday the 22nd   Wrist Pain    L wrist still bothering her w/ movement- pain has started to radiate up arm within the last couple of days, R wrist much better   Knee Pain    intermittent pain in L knee   Wrist Pain   Knee Pain    Brianna Black is a 48 y.o. female with hx of HTN who presents to the [facility] for F/U apt of 1 week ago fall. States she was taking a walk 1 week ago and reports falling forward and is still having pain. States she has had several x-rays of hands and knees; was given hand braces to wear. States she does not feel her L lateral wrist and L knee improving. Reports having a stinging sensation to the inner palm of her L hand. States her L knee is sensitive and still in pain; denies having pain when walking but reports having pain with pressure. She states has pain medications from her back surgery (done by Dr. Jillyn Hidden); has taken Ibuprofen without relief. Denies hx of DM and arthritis.   Does not currently work; will have an interview on Monday 12/20/2013.  Patient Active Problem List   Diagnosis Date Noted   HNP (herniated nucleus pulposus), lumbar 07/26/2013   Lumbar stenosis with neurogenic claudication 07/26/2013   Current outpatient prescriptions:atenolol (TENORMIN) 25 MG tablet, Take 25 mg by mouth at bedtime., Disp: , Rfl: ;  fluticasone (FLONASE) 50 MCG/ACT nasal spray, Place 1 spray into both nostrils daily as needed for allergies or rhinitis., Disp: , Rfl: ;  gabapentin (NEURONTIN) 400 MG capsule, Take 800 mg by mouth 2 (two) times daily. , Disp: , Rfl:  hydrochlorothiazide (MICROZIDE) 12.5 MG capsule, Take 12.5 mg by mouth every  morning., Disp: , Rfl: ;  ibuprofen (ADVIL,MOTRIN) 200 MG tablet, Take 800 mg by mouth every 6 (six) hours as needed (pain)., Disp: , Rfl: ;  methocarbamol (ROBAXIN) 500 MG tablet, Take 1 tablet (500 mg total) by mouth 3 (three) times daily between meals as needed for muscle spasms., Disp: 40 tablet, Rfl: 1 Multiple Vitamin (MULTIVITAMIN WITH MINERALS) TABS tablet, Take 1 tablet by mouth daily., Disp: , Rfl: ;  norethindrone (CAMILA) 0.35 MG tablet, Take 1 tablet by mouth every morning., Disp: , Rfl: ;  oxyCODONE-acetaminophen (PERCOCET) 7.5-325 MG per tablet, Take 1-2 tablets by mouth every 4 (four) hours as needed for pain., Disp: 40 tablet, Rfl: 0;  simvastatin (ZOCOR) 20 MG tablet, Take 20 mg by mouth at bedtime., Disp: , Rfl:   Review of Systems  Musculoskeletal: Negative for back pain.  Skin: Negative for wound.   Objective:   Physical Exam  Nursing note and vitals reviewed. Constitutional: She is oriented to person, place, and time. She appears well-developed and well-nourished. No distress.  HENT:  Head: Normocephalic and atraumatic.  Right Ear: External ear normal.  Left Ear: External ear normal.  Eyes: Conjunctivae are normal. Pupils are equal, round, and reactive to light.  Neck: Normal range of motion. Neck supple.  Cardiovascular: Normal rate.   No murmur heard. Pulmonary/Chest: Effort normal.  Abdominal: Bowel sounds are normal.  Musculoskeletal: Normal range of motion.  Is mildly tender to L ulnar styloid. Full ROM of both wrist and hands. Has a swollen prepatellar area to L lower leg. Good ROM of knee.  Neurological: She is alert and oriented to person, place, and time. She has normal reflexes.  Skin: Skin is warm and dry. She is not diaphoretic.  Healing abrasions to both palms.   Psychiatric: She has a normal mood and affect. Her behavior is normal.   Triage Vitals:BP 118/78   Pulse 70   Temp(Src) 98.8 F (37.1 C) (Oral)   Resp 16   Ht 5' 3.5" (1.613 m)   Wt 203 lb 3.2  oz (92.171 kg)   BMI 35.43 kg/m2   SpO2 98%   LMP 12/03/2013 Assessment & Plan:   Left wrist sprain - Plan: predniSONE (DELTASONE) 20 MG tablet  Contusion of left knee - Plan: predniSONE (DELTASONE) 20 MG tablet  Fall  Signed, Elvina SidleKurt Lauenstein, MD

## 2013-12-22 ENCOUNTER — Ambulatory Visit: Payer: BC Managed Care – PPO

## 2013-12-22 ENCOUNTER — Ambulatory Visit (INDEPENDENT_AMBULATORY_CARE_PROVIDER_SITE_OTHER): Payer: BC Managed Care – PPO | Admitting: Physician Assistant

## 2013-12-22 VITALS — BP 120/84 | HR 65 | Temp 98.2°F | Resp 16 | Ht 63.0 in | Wt 199.8 lb

## 2013-12-22 DIAGNOSIS — M25532 Pain in left wrist: Secondary | ICD-10-CM

## 2013-12-22 DIAGNOSIS — S63502A Unspecified sprain of left wrist, initial encounter: Secondary | ICD-10-CM

## 2013-12-22 DIAGNOSIS — M25539 Pain in unspecified wrist: Secondary | ICD-10-CM

## 2013-12-22 MED ORDER — OXYCODONE-ACETAMINOPHEN 7.5-325 MG PO TABS: 1.0000 | ORAL_TABLET | Freq: Four times a day (QID) | ORAL | Status: AC | PRN

## 2013-12-22 NOTE — Progress Notes (Signed)
   Subjective:    Patient ID: Brianna Black, female    DOB: Mar 15, 1966, 48 y.o.   MRN: [id number]  HPI 48 year old female presents for evaluation of continued left wrist pain s/p a fall onto her outstretched hands on 5/22.  Initially had pain in both wrists and both knees - does admit that the pain in her right wrist has improved significantly as well as the pain in her knees.  Was rechecked on 5/29 and placed on a short prednisone burst which she does believe helped somewhat.  However, she continues to have pain and intermittent paresthesias. Now feels like she is developing weakness with her left hand grip.    Has taken oxycodone that she had left over from her back surgery sparingly. Is almost out of this. Would like a small amount as a refill if possible.  Has also taken Advil which does help.   Continues to wear wrist splints as needed during the day and each night.    Review of Systems  Musculoskeletal: Positive for arthralgias and joint swelling.  Skin: Negative for color change and wound.  Neurological: Positive for weakness and numbness.       Objective:   Physical Exam  Constitutional: She is oriented to person, place, and time. She appears well-developed and well-nourished.  HENT:  Head: Normocephalic and atraumatic.  Right Ear: External ear normal.  Left Ear: External ear normal.  Eyes: Conjunctivae are normal.  Neck: Normal range of motion.  Cardiovascular: Normal rate.   Pulmonary/Chest: Effort normal.  Musculoskeletal:       Left wrist: She exhibits tenderness (distal radius and ulna) and bony tenderness. She exhibits normal range of motion and no swelling.  Capillary refill normal <2 seconds, sensation intact, 5/5 strength  Neurological: She is alert and oriented to person, place, and time.  Psychiatric: She has a normal mood and affect. Her behavior is normal. Judgment and thought content normal.         [facility] reading (PRIMARY) by  Dr. Alwyn Ren as no acute bony  abnormality.   Assessment & Plan:   Wrist pain, left - Plan: DG Wrist Complete Left, Ambulatory referral to Orthopedic Surgery, oxyCODONE-acetaminophen (PERCOCET) 7.5-325 MG per tablet  Sprain of wrist, left  Continue to wear wrist sprint as needed during the day and every night Refilled small quantity of oxycodone to take as needed for severe pain Continue Advil as directed during the day Referral to orthopedics for further evaluation and management.

## 2014-01-12 ENCOUNTER — Encounter: Payer: Self-pay | Admitting: *Deleted

## 2014-07-04 IMAGING — CR DG CHEST 2V
2 series · 2 of 2 positions shown · non-contrast
Comparison: 01/20/2008

CLINICAL DATA: Pre operative respiratory exam. Lumbar disc disease.

EXAM:
CHEST  2 VIEW

[w chest pa]
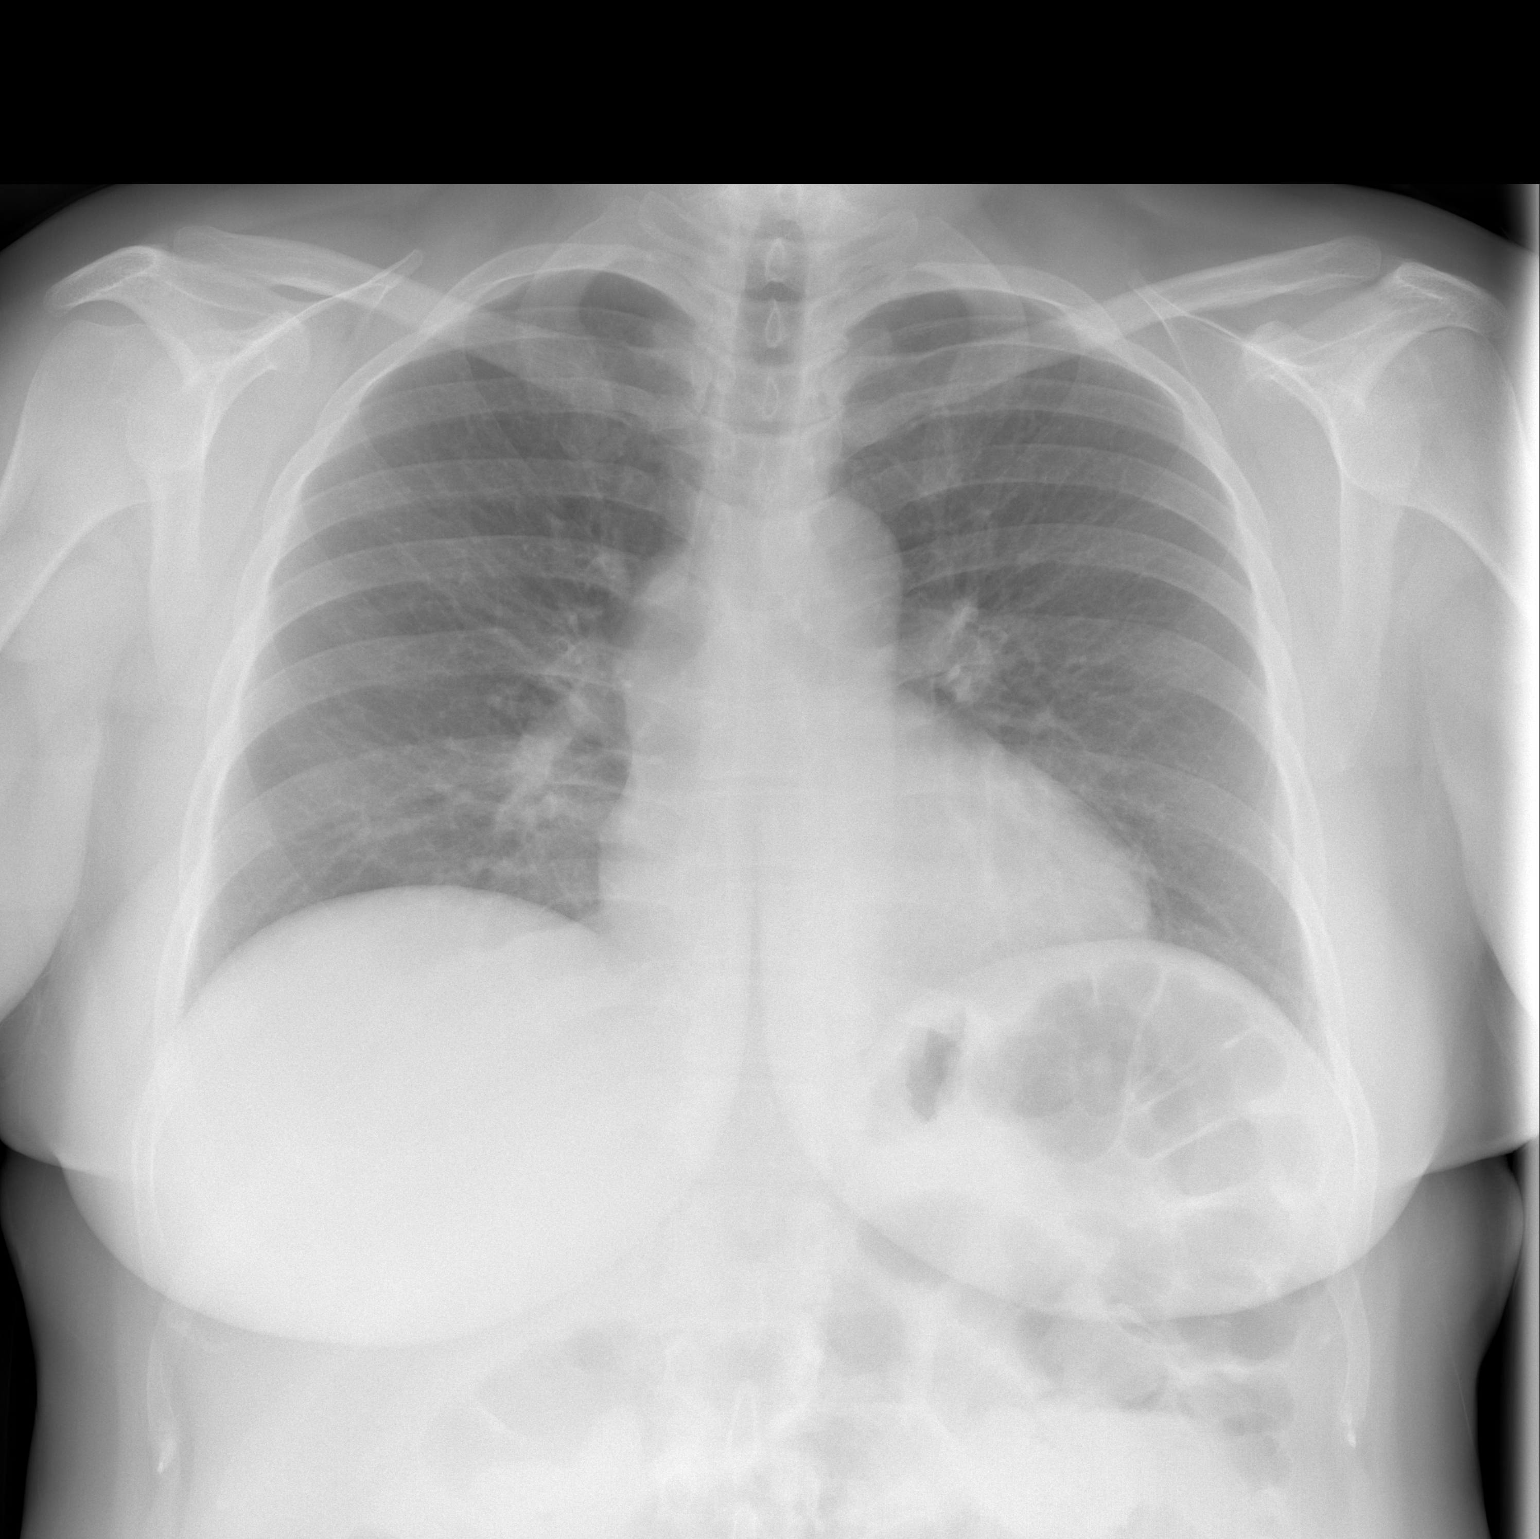

[w chest lat]
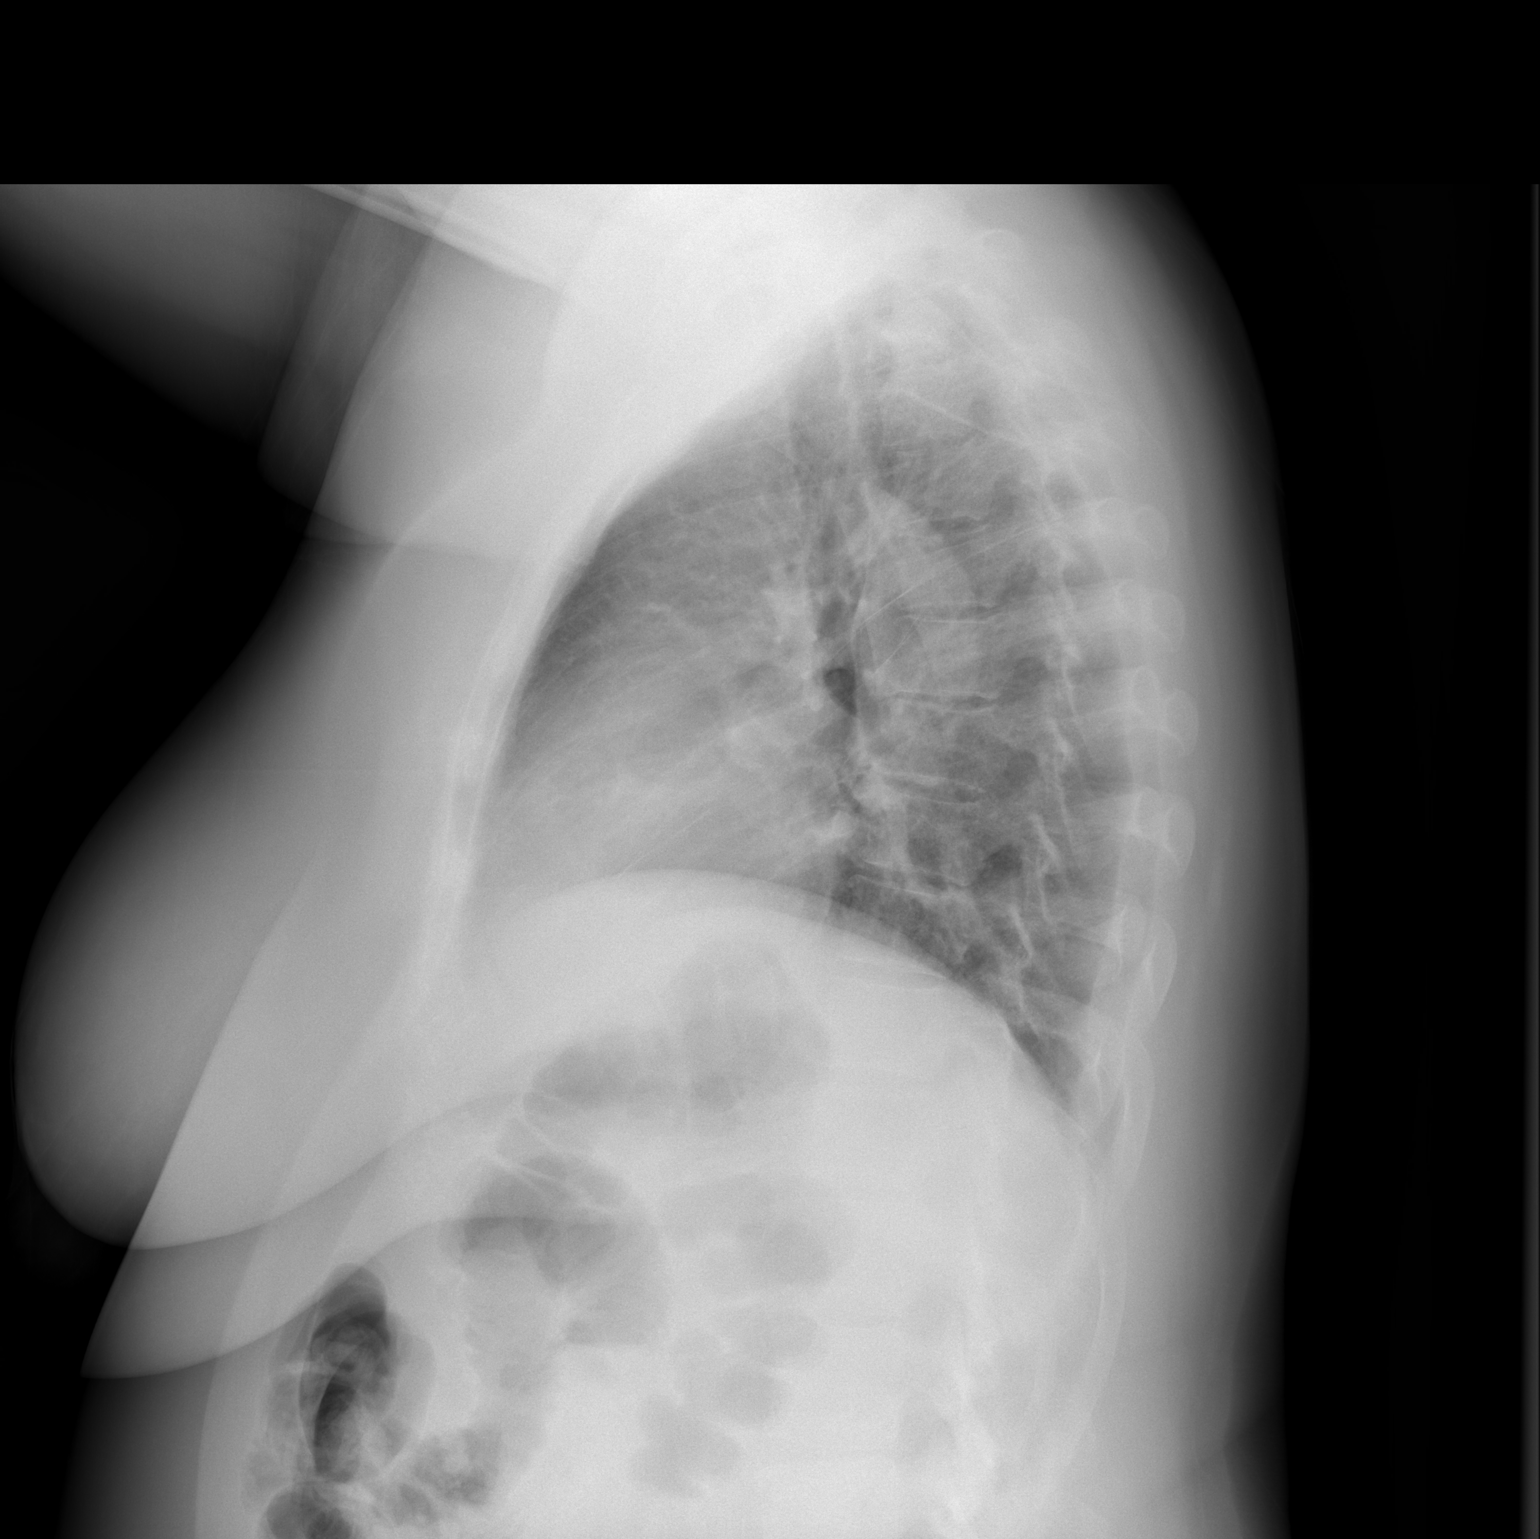

[2 of 2 positions shown; findings below may reference images not displayed]

FINDINGS: The heart size and mediastinal contours are within normal limits.
Both lungs are clear. The visualized skeletal structures are
unremarkable.
IMPRESSION: Normal chest.

## 2014-07-07 IMAGING — CR DG SPINE 1V PORT
1 series · 1 of 1 positions shown · non-contrast
Comparison: Film from earlier in the same day

CLINICAL DATA: Micro lumbar decompression

EXAM:
PORTABLE SPINE - 1 VIEW

[lateral]
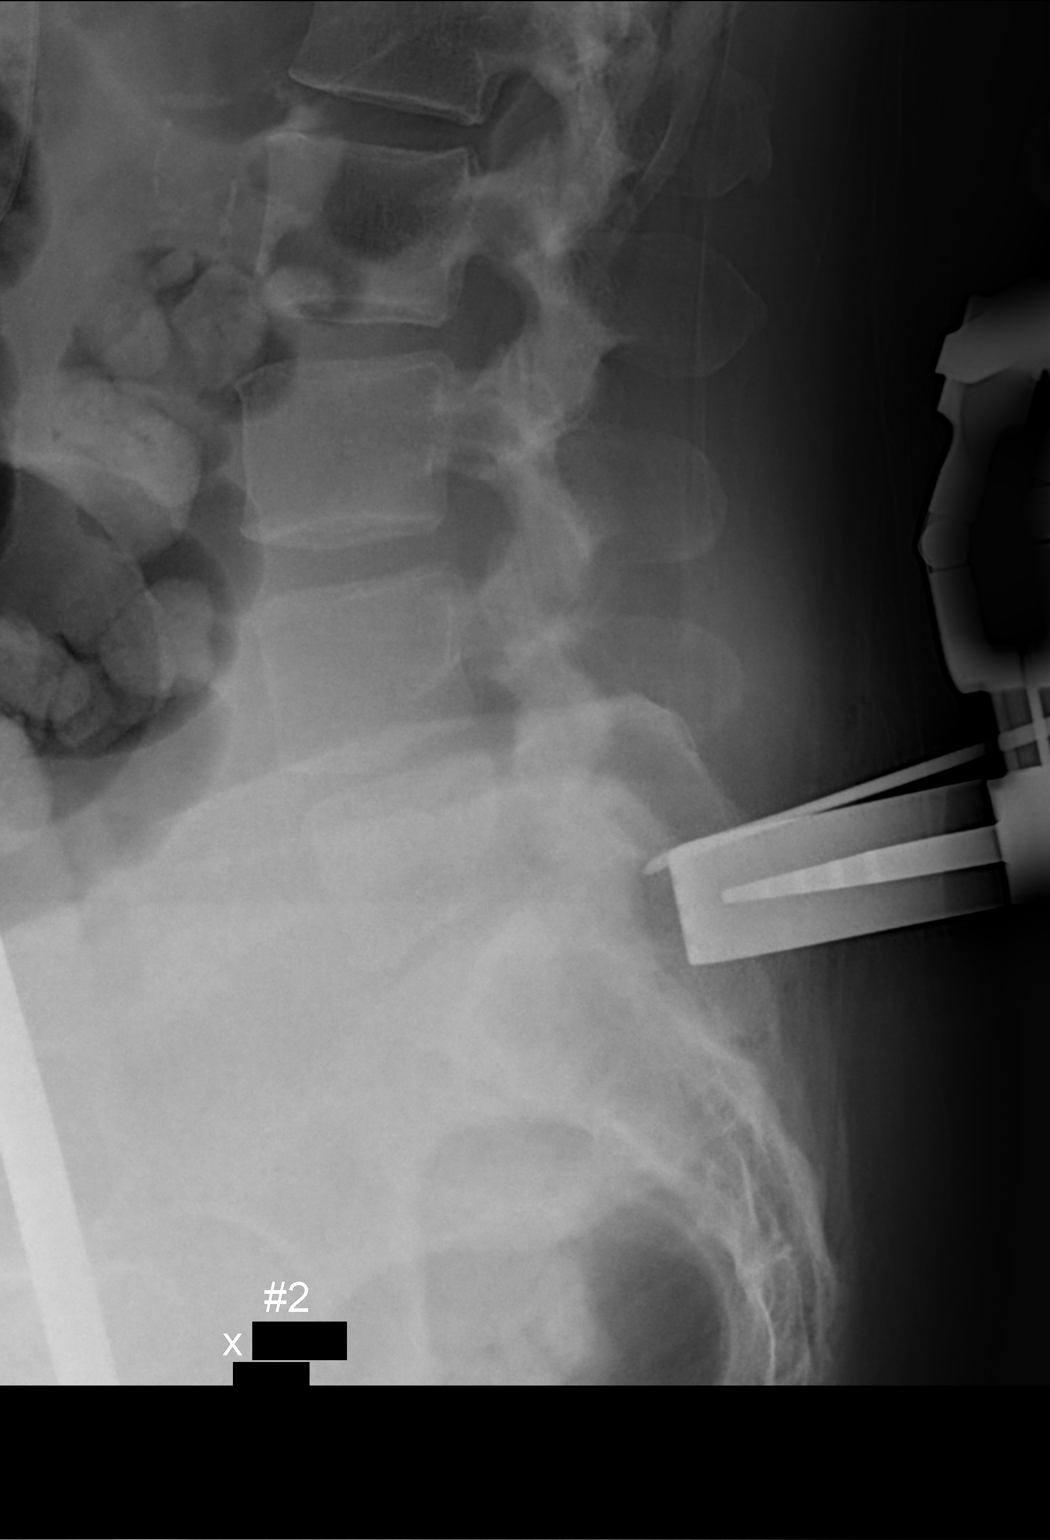

[1 of 1 positions shown; findings below may reference images not displayed]

FINDINGS: Five lumbar type vertebral bodies are again seen. The numbering
nomenclature similar to that used on the prior exam. Disc space
narrowing remains at L4-5 and L5-S1. Surgical instruments are noted
at the L5-S1 level consistent with the given clinical history.

## 2015-05-22 ENCOUNTER — Other Ambulatory Visit: Payer: Self-pay

## 2015-06-06 ENCOUNTER — Other Ambulatory Visit: Payer: Self-pay | Admitting: Family Medicine

## 2015-06-06 DIAGNOSIS — Z803 Family history of malignant neoplasm of breast: Secondary | ICD-10-CM

## 2015-06-06 DIAGNOSIS — N644 Mastodynia: Secondary | ICD-10-CM

## 2015-06-08 ENCOUNTER — Ambulatory Visit
Admission: RE | Admit: 2015-06-08 | Discharge: 2015-06-08 | Disposition: A | Discharge status: Home / Self Care | Payer: 59 | Source: Ambulatory Visit | Attending: Family Medicine | Admitting: Family Medicine

## 2015-06-08 DIAGNOSIS — N644 Mastodynia: Secondary | ICD-10-CM

## 2015-06-08 DIAGNOSIS — Z803 Family history of malignant neoplasm of breast: Secondary | ICD-10-CM

## 2015-08-07 ENCOUNTER — Other Ambulatory Visit: Payer: Self-pay | Admitting: Urology

## 2015-08-07 DIAGNOSIS — N281 Cyst of kidney, acquired: Secondary | ICD-10-CM

## 2015-08-16 ENCOUNTER — Ambulatory Visit (HOSPITAL_COMMUNITY)
Admission: RE | Admit: 2015-08-16 | Discharge: 2015-08-16 | Disposition: A | Discharge status: Home / Self Care | Payer: BLUE CROSS/BLUE SHIELD | Source: Ambulatory Visit | Attending: Urology | Admitting: Urology

## 2015-08-16 DIAGNOSIS — R109 Unspecified abdominal pain: Secondary | ICD-10-CM | POA: Insufficient documentation

## 2015-08-16 DIAGNOSIS — N281 Cyst of kidney, acquired: Secondary | ICD-10-CM | POA: Diagnosis not present

## 2015-08-16 MED ORDER — GADOBENATE DIMEGLUMINE 529 MG/ML IV SOLN
20.0000 mL | Freq: Once | INTRAVENOUS | Status: AC | PRN
  Administered 2015-08-16: 19 mL via INTRAVENOUS

## 2015-10-31 ENCOUNTER — Other Ambulatory Visit (HOSPITAL_COMMUNITY): Payer: Self-pay | Admitting: *Deleted

## 2015-11-01 ENCOUNTER — Inpatient Hospital Stay (HOSPITAL_COMMUNITY): Admission: RE | Admit: 2015-11-01 | Payer: BLUE CROSS/BLUE SHIELD | Source: Ambulatory Visit

## 2015-11-07 ENCOUNTER — Other Ambulatory Visit (HOSPITAL_COMMUNITY): Payer: Self-pay | Admitting: *Deleted

## 2015-11-08 ENCOUNTER — Ambulatory Visit (HOSPITAL_COMMUNITY)
Admission: RE | Admit: 2015-11-08 | Discharge: 2015-11-08 | Disposition: A | Discharge status: Home / Self Care | Payer: BLUE CROSS/BLUE SHIELD | Source: Ambulatory Visit | Attending: Nephrology | Admitting: Nephrology

## 2015-11-08 DIAGNOSIS — E611 Iron deficiency: Secondary | ICD-10-CM | POA: Diagnosis not present

## 2015-11-08 MED ORDER — SODIUM CHLORIDE 0.9 % IV SOLN
500.0000 mg | Freq: Once | INTRAVENOUS | Status: AC
  Administered 2015-11-08: 500 mg via INTRAVENOUS
  Filled 2015-11-08: qty 25

## 2015-11-08 MED ORDER — SODIUM CHLORIDE 0.9 % IV SOLN
500.0000 mg | Freq: Once | INTRAVENOUS | Status: DC
  Filled 2015-11-08: qty 40

## 2015-11-08 MED ORDER — DIPHENHYDRAMINE HCL 50 MG/ML IJ SOLN
25.0000 mg | Freq: Once | INTRAMUSCULAR | Status: AC
  Administered 2015-11-08: 25 mg via INTRAVENOUS

## 2015-11-08 MED ORDER — DIPHENHYDRAMINE HCL 50 MG/ML IJ SOLN
INTRAMUSCULAR | Status: AC
  Administered 2015-11-08: 25 mg via INTRAVENOUS
  Filled 2015-11-08: qty 1

## 2016-06-03 ENCOUNTER — Other Ambulatory Visit: Payer: Self-pay | Admitting: Family Medicine

## 2016-06-03 DIAGNOSIS — Z1231 Encounter for screening mammogram for malignant neoplasm of breast: Secondary | ICD-10-CM

## 2016-06-21 ENCOUNTER — Other Ambulatory Visit: Payer: Self-pay | Admitting: Orthopedic Surgery

## 2016-06-21 DIAGNOSIS — T148XXA Other injury of unspecified body region, initial encounter: Secondary | ICD-10-CM

## 2016-06-22 ENCOUNTER — Ambulatory Visit
Admission: RE | Admit: 2016-06-22 | Discharge: 2016-06-22 | Disposition: A | Discharge status: Home / Self Care | Payer: BLUE CROSS/BLUE SHIELD | Source: Ambulatory Visit | Attending: Orthopedic Surgery | Admitting: Orthopedic Surgery

## 2016-06-22 DIAGNOSIS — T148XXA Other injury of unspecified body region, initial encounter: Secondary | ICD-10-CM

## 2016-07-02 ENCOUNTER — Ambulatory Visit
Admission: RE | Admit: 2016-07-02 | Discharge: 2016-07-02 | Disposition: A | Discharge status: Home / Self Care | Payer: BLUE CROSS/BLUE SHIELD | Source: Ambulatory Visit | Attending: Family Medicine | Admitting: Family Medicine

## 2016-07-02 DIAGNOSIS — Z1231 Encounter for screening mammogram for malignant neoplasm of breast: Secondary | ICD-10-CM

## 2016-07-05 ENCOUNTER — Ambulatory Visit: Payer: BLUE CROSS/BLUE SHIELD

## 2016-07-09 ENCOUNTER — Ambulatory Visit: Payer: BLUE CROSS/BLUE SHIELD

## 2017-01-13 ENCOUNTER — Other Ambulatory Visit: Payer: Self-pay | Admitting: Family Medicine

## 2017-01-13 ENCOUNTER — Other Ambulatory Visit (HOSPITAL_COMMUNITY)
Admission: RE | Admit: 2017-01-13 | Discharge: 2017-01-13 | Disposition: A | Discharge status: Home / Self Care | Payer: BLUE CROSS/BLUE SHIELD | Source: Ambulatory Visit | Attending: Family Medicine | Admitting: Family Medicine

## 2017-01-13 DIAGNOSIS — Z01411 Encounter for gynecological examination (general) (routine) with abnormal findings: Secondary | ICD-10-CM | POA: Diagnosis present

## 2017-01-13 DIAGNOSIS — Z1151 Encounter for screening for human papillomavirus (HPV): Secondary | ICD-10-CM | POA: Diagnosis not present

## 2017-01-14 LAB — CYTOLOGY - PAP
Diagnosis: NEGATIVE
HPV (WINDOPATH): NOT DETECTED

## 2018-01-12 ENCOUNTER — Other Ambulatory Visit: Payer: Self-pay | Admitting: Family Medicine

## 2018-01-12 DIAGNOSIS — Z1231 Encounter for screening mammogram for malignant neoplasm of breast: Secondary | ICD-10-CM

## 2018-01-15 ENCOUNTER — Ambulatory Visit
Admission: RE | Admit: 2018-01-15 | Discharge: 2018-01-15 | Disposition: A | Discharge status: Home / Self Care | Payer: BLUE CROSS/BLUE SHIELD | Source: Ambulatory Visit | Attending: Family Medicine | Admitting: Family Medicine

## 2018-01-15 DIAGNOSIS — Z1231 Encounter for screening mammogram for malignant neoplasm of breast: Secondary | ICD-10-CM

## 2019-08-12 IMAGING — MG DIGITAL SCREENING BILATERAL MAMMOGRAM WITH TOMO AND CAD
8 series · 8 of 24 positions shown · non-contrast
Comparison: Previous exam(s).

ACR Breast Density Category a: The breast tissue is almost entirely
fatty.

CLINICAL DATA: Screening.

EXAM:
DIGITAL SCREENING BILATERAL MAMMOGRAM WITH TOMO AND CAD

[L CC synth-2D]
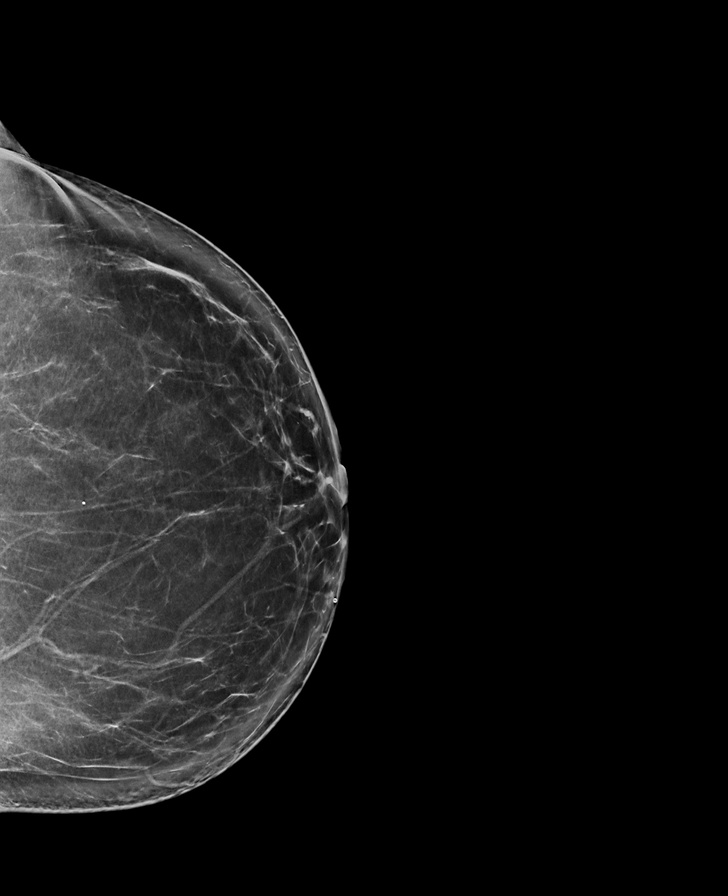

[R MLO synth-2D]
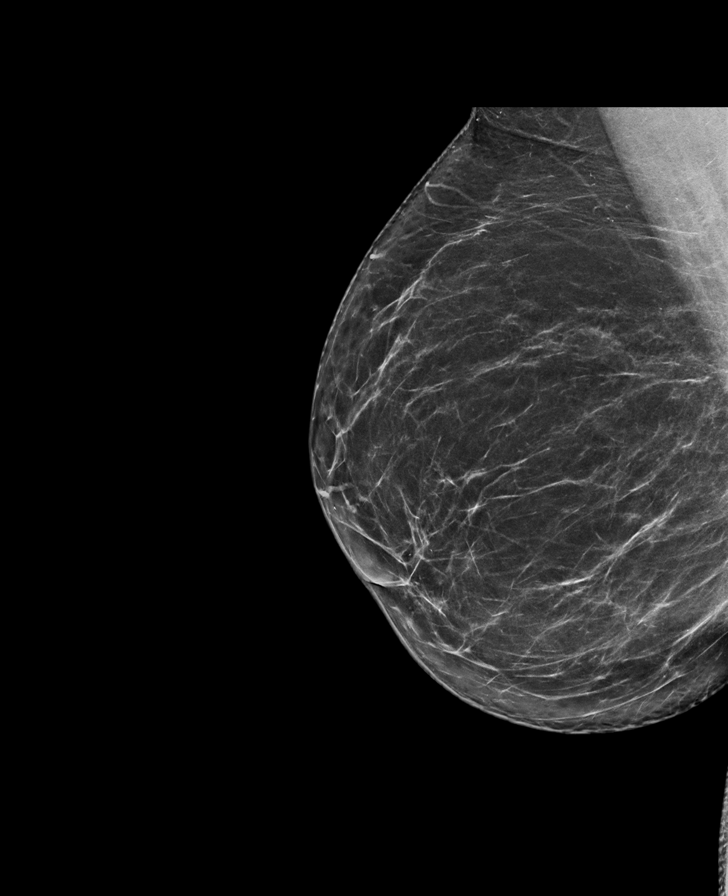

[R CC synth-2D]
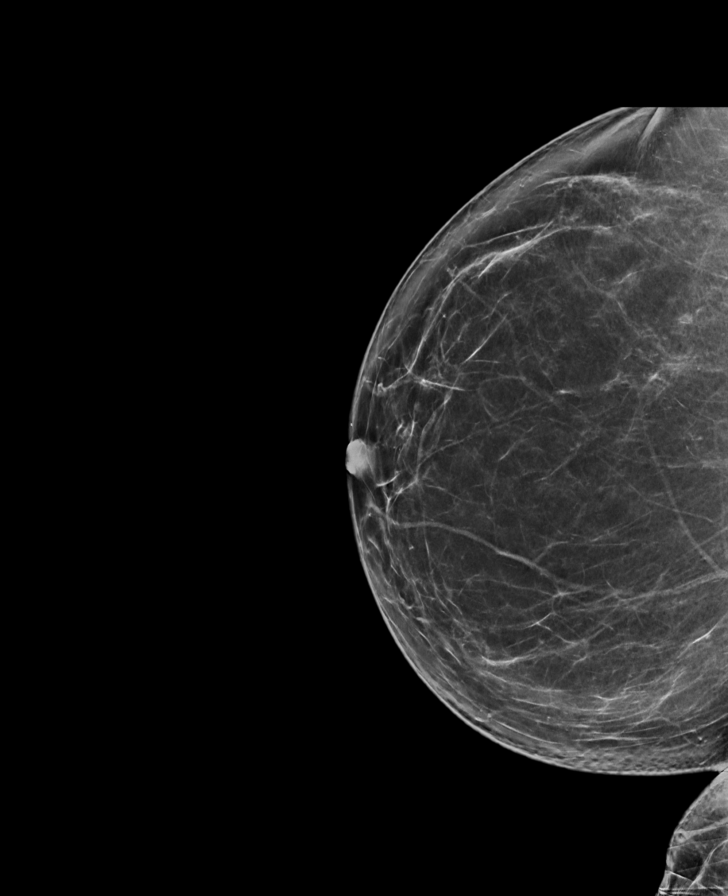

[L MLO synth-2D]
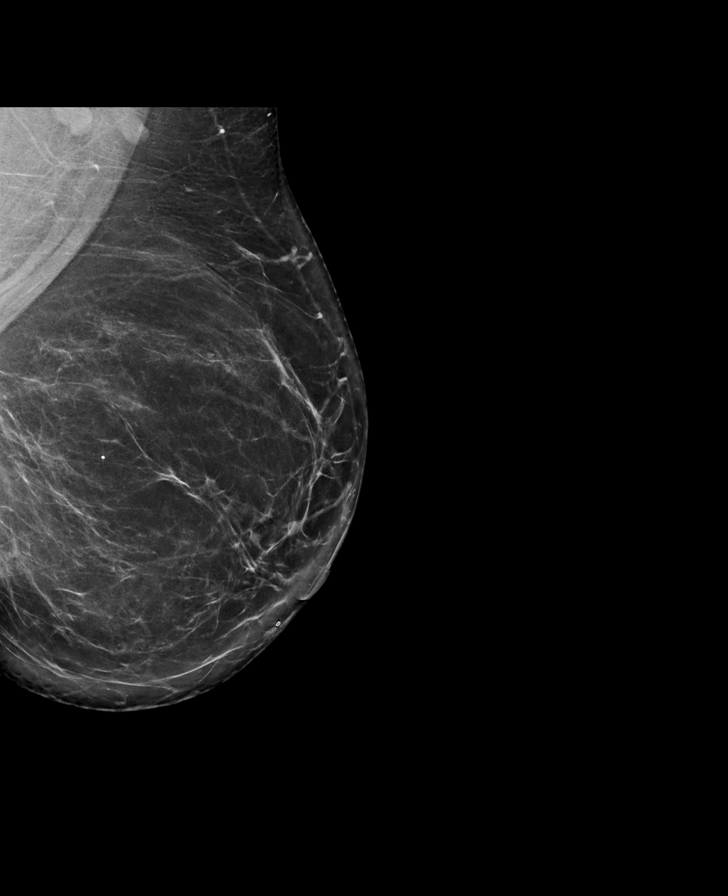

[L CC tomo · tomo slice 43/85.0]
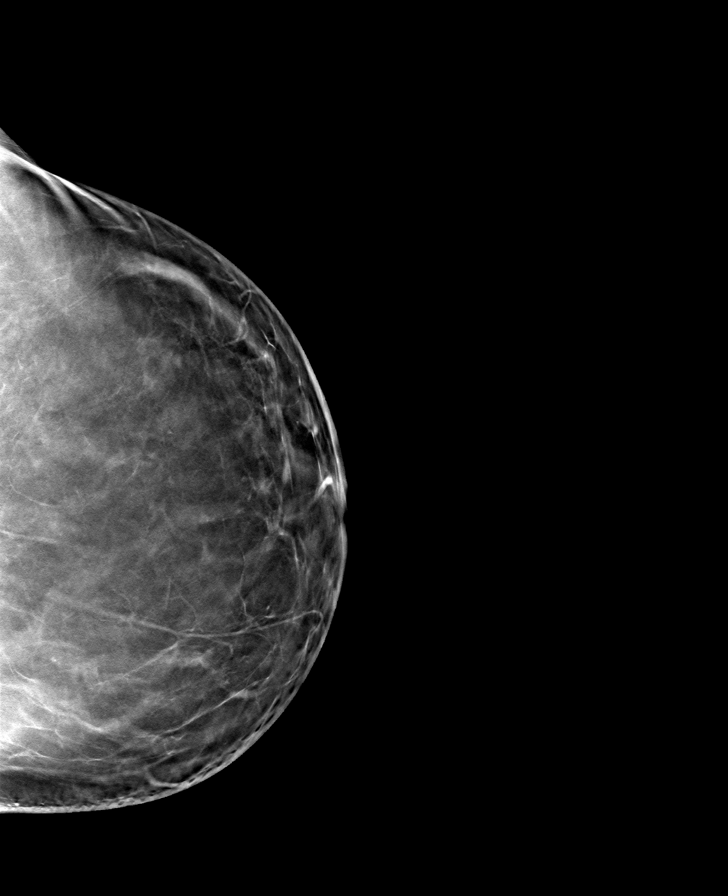

[R CC tomo · tomo slice 41/82.0]
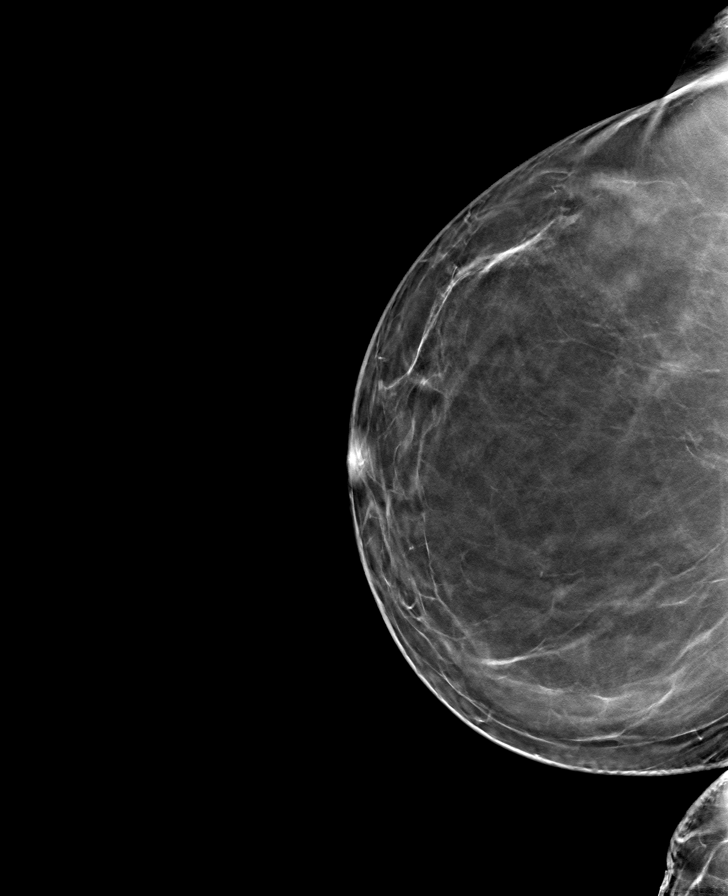

[R MLO tomo · tomo slice 44/87.0]
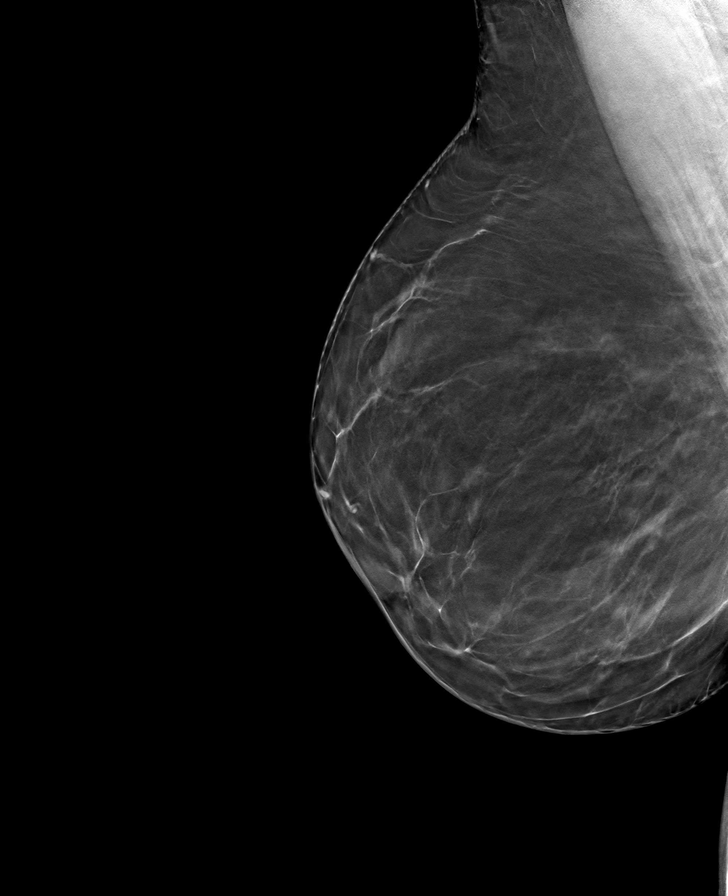

[L MLO tomo · tomo slice 47/94.0]
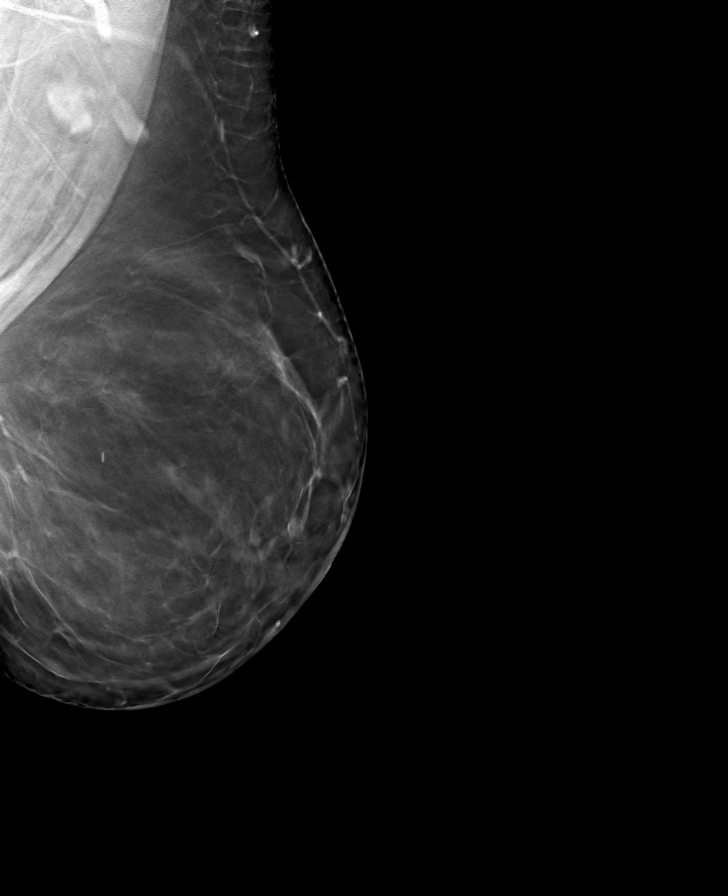

[8 of 24 positions shown; findings below may reference images not displayed]

FINDINGS: There are no findings suspicious for malignancy. Images were
processed with CAD.
IMPRESSION: No mammographic evidence of malignancy. A result letter of this
screening mammogram will be mailed directly to the patient.

RECOMMENDATION:
Screening mammogram in one year. (Code:[id number]-[id number])

BI-RADS CATEGORY  1: Negative.

## 2023-06-18 ENCOUNTER — Encounter (HOSPITAL_BASED_OUTPATIENT_CLINIC_OR_DEPARTMENT_OTHER): Payer: Self-pay | Admitting: Emergency Medicine

## 2023-06-18 ENCOUNTER — Other Ambulatory Visit: Payer: Self-pay

## 2023-06-18 ENCOUNTER — Emergency Department (HOSPITAL_BASED_OUTPATIENT_CLINIC_OR_DEPARTMENT_OTHER): Payer: BC Managed Care – PPO

## 2023-06-18 ENCOUNTER — Emergency Department (HOSPITAL_BASED_OUTPATIENT_CLINIC_OR_DEPARTMENT_OTHER)
Admission: EM | Admit: 2023-06-18 | Discharge: 2023-06-19 | Disposition: A | Discharge status: Home / Self Care | Payer: BC Managed Care – PPO | Attending: Emergency Medicine | Admitting: Emergency Medicine

## 2023-06-18 DIAGNOSIS — N200 Calculus of kidney: Secondary | ICD-10-CM | POA: Insufficient documentation

## 2023-06-18 DIAGNOSIS — R109 Unspecified abdominal pain: Secondary | ICD-10-CM | POA: Diagnosis present

## 2023-06-18 LAB — CBC
HCT: 36.9 % (ref 36.0–46.0)
Hemoglobin: 12.7 g/dL (ref 12.0–15.0)
MCH: 29.9 pg (ref 26.0–34.0)
MCHC: 34.4 g/dL (ref 30.0–36.0)
MCV: 86.8 fL (ref 80.0–100.0)
Platelets: 377 10*3/uL (ref 150–400)
RBC: 4.25 MIL/uL (ref 3.87–5.11)
RDW: 12.6 % (ref 11.5–15.5)
WBC: 14.2 10*3/uL — ABNORMAL HIGH (ref 4.0–10.5)
nRBC: 0 % (ref 0.0–0.2)

## 2023-06-18 LAB — URINALYSIS, ROUTINE W REFLEX MICROSCOPIC
Bacteria, UA: NONE SEEN
Bilirubin Urine: NEGATIVE
Glucose, UA: NEGATIVE mg/dL
Ketones, ur: NEGATIVE mg/dL
Nitrite: NEGATIVE
Protein, ur: NEGATIVE mg/dL
Specific Gravity, Urine: 1.03 (ref 1.005–1.030)
pH: 6.5 (ref 5.0–8.0)

## 2023-06-18 LAB — COMPREHENSIVE METABOLIC PANEL
ALT: 24 U/L (ref 0–44)
AST: 20 U/L (ref 15–41)
Albumin: 4.3 g/dL (ref 3.5–5.0)
Alkaline Phosphatase: 81 U/L (ref 38–126)
Anion gap: 11 (ref 5–15)
BUN: 23 mg/dL — ABNORMAL HIGH (ref 6–20)
CO2: 25 mmol/L (ref 22–32)
Calcium: 9.7 mg/dL (ref 8.9–10.3)
Chloride: 102 mmol/L (ref 98–111)
Creatinine, Ser: 0.76 mg/dL (ref 0.44–1.00)
GFR, Estimated: >60 mL/min (ref >60)
Glucose, Bld: 108 mg/dL — ABNORMAL HIGH (ref 70–99)
Potassium: 3.7 mmol/L (ref 3.5–5.1)
Sodium: 138 mmol/L (ref 135–145)
Total Bilirubin: 0.9 mg/dL (ref <1.2)
Total Protein: 7.5 g/dL (ref 6.5–8.1)

## 2023-06-18 LAB — TROPONIN I (HIGH SENSITIVITY)
Troponin I (High Sensitivity): 2 ng/L (ref <18)
Troponin I (High Sensitivity): 2 ng/L (ref <18)

## 2023-06-18 LAB — LIPASE, BLOOD: Lipase: 10 U/L — ABNORMAL LOW (ref 11–51)

## 2023-06-18 MED ORDER — SODIUM CHLORIDE 0.9 % IV BOLUS
1000.0000 mL | Freq: Once | INTRAVENOUS | Status: AC
  Administered 2023-06-18: 1000 mL via INTRAVENOUS

## 2023-06-18 MED ORDER — KETOROLAC TROMETHAMINE 30 MG/ML IJ SOLN
30.0000 mg | Freq: Once | INTRAMUSCULAR | Status: AC
  Administered 2023-06-18: 30 mg via INTRAVENOUS
  Filled 2023-06-18: qty 1

## 2023-06-18 MED ORDER — TAMSULOSIN HCL 0.4 MG PO CAPS
0.4000 mg | ORAL_CAPSULE | Freq: Once | ORAL | Status: AC
Start: 2023-06-19 — End: 2023-06-19
  Administered 2023-06-19: 0.4 mg via ORAL
  Filled 2023-06-18: qty 1

## 2023-06-18 MED ORDER — IOHEXOL 300 MG/ML  SOLN
100.0000 mL | Freq: Once | INTRAMUSCULAR | Status: AC | PRN
  Administered 2023-06-18: 80 mL via INTRAVENOUS

## 2023-06-18 NOTE — ED Triage Notes (Signed)
Patient c/o left side abd pain after eating cereal this morning.  Patient also endorses nausea, no v/d.

## 2023-06-18 NOTE — ED Notes (Signed)
Pt transported back to room from CT

## 2023-06-18 NOTE — ED Notes (Signed)
Pt transported to CT via wheelchair.

## 2023-06-18 NOTE — ED Provider Notes (Signed)
[facility] [place] EMERGENCY DEPARTMENT AT [facility] Provider Note   CSN: [id number] Arrival date & time: 06/18/23  1931     History {Add pertinent medical, surgical, social history, OB history to HPI:1} Chief Complaint  Patient presents with   Abdominal Pain    Brianna Black is a 57 y.o. female.  With a past history of status post 2X cesarean section who presents to the ED for abdominal pain.  Patient was having her normal breakfast (cereal) this morning when she began to develop acute onset left-sided abdominal pain which has persisted since then.  Pain localized over left lower quadrant and left flank with radiation down to the left groin.  Associated nausea no vomiting, urinary symptoms, fevers, chills or changes in bowel habits.  Last bowel movement was this morning was normal without blood.  1 similar episode of postprandial abdominal pain last year with an unclear cause at that time.  She is scheduled for routine colonoscopy next week.  Abdominal Pain      Home Medications Prior to Admission medications   Medication Sig Start Date End Date Taking? Authorizing Provider  atenolol (TENORMIN) 25 MG tablet Take 25 mg by mouth at bedtime.    [provider]  fluticasone (FLONASE) 50 MCG/ACT nasal spray Place 1 spray into both nostrils daily as needed for allergies or rhinitis.    [provider]  gabapentin (NEURONTIN) 400 MG capsule Take 800 mg by mouth 2 (two) times daily.     [provider]  hydrochlorothiazide (MICROZIDE) 12.5 MG capsule Take 12.5 mg by mouth every morning.    [provider]  ibuprofen (ADVIL,MOTRIN) 200 MG tablet Take 800 mg by mouth every 6 (six) hours as needed (pain).    [provider]  methocarbamol (ROBAXIN) 500 MG tablet Take 1 tablet (500 mg total) by mouth 3 (three) times daily between meals as needed for muscle spasms. 07/26/13   Jene Every, MD  Multiple Vitamin (MULTIVITAMIN WITH MINERALS) TABS  tablet Take 1 tablet by mouth daily.    [provider]  norethindrone (CAMILA) 0.35 MG tablet Take 1 tablet by mouth every morning.    [provider]  oxyCODONE-acetaminophen (PERCOCET) 7.5-325 MG per tablet Take 1 tablet by mouth every 6 (six) hours as needed for pain. 12/22/13   Nelva Nay, PA-C  predniSONE (DELTASONE) 20 MG tablet 2 daily with food 12/17/13   Elvina Sidle, MD  simvastatin (ZOCOR) 20 MG tablet Take 20 mg by mouth at bedtime.    [provider]      Allergies    Ciprofloxacin hcl, Sulfa antibiotics, and Penicillins    Review of Systems   Review of Systems  Gastrointestinal:  Positive for abdominal pain.    Physical Exam Updated Vital Signs BP (!) 117/55   Pulse 79   Temp 98 F (36.7 C) (Oral)   Resp 14   Ht 5\' 2"  (1.575 m)   Wt 93 kg   LMP 12/03/2013   SpO2 97%   BMI 37.49 kg/m  Physical Exam Vitals and nursing note reviewed.  HENT:     Head: Normocephalic and atraumatic.  Eyes:     Pupils: Pupils are equal, round, and reactive to light.  Cardiovascular:     Rate and Rhythm: Normal rate and regular rhythm.  Pulmonary:     Effort: Pulmonary effort is normal.     Breath sounds: Normal breath sounds.  Abdominal:     Palpations: Abdomen is soft.  Tenderness: There is abdominal tenderness in the left lower quadrant. There is left CVA tenderness. There is no guarding or rebound.  Skin:    General: Skin is warm and dry.  Neurological:     Mental Status: She is alert.  Psychiatric:        Mood and Affect: Mood normal.     ED Results / Procedures / Treatments   Labs (all labs ordered are listed, but only abnormal results are displayed) Labs Reviewed  LIPASE, BLOOD - Abnormal; Notable for the following components:      Result Value   Lipase 10 (*)    All other components within normal limits  COMPREHENSIVE METABOLIC PANEL - Abnormal; Notable for the following components:   Glucose, Bld 108 (*)    BUN 23 (*)     All other components within normal limits  CBC - Abnormal; Notable for the following components:   WBC 14.2 (*)    All other components within normal limits  URINALYSIS, ROUTINE W REFLEX MICROSCOPIC  TROPONIN I (HIGH SENSITIVITY)    EKG None  Radiology No results found.  Procedures Procedures  {Document cardiac monitor, telemetry assessment procedure when appropriate:1}  Medications Ordered in ED Medications  sodium chloride 0.9 % bolus 1,000 mL (has no administration in time range)  ketorolac (TORADOL) 30 MG/ML injection 30 mg (has no administration in time range)    ED Course/ Medical Decision Making/ A&P   {   Click here for ABCD2, HEART and other calculatorsREFRESH Note before signing :1}                              Medical Decision Making 57 year old female with history as above presenting for acute abdominal pain beginning this morning.  Afebrile and normotensive here.  Exam notable for left flank and left lower quadrant tenderness with left CVA tenderness.  Differential diagnosis includes: Acute intra-abdominal infectious/inflammatory process such as appendicitis, diverticulitis, pancreatitis and cholecystitis Urinary tract infection Kidney stone Viral gastroenteritis  Will obtain laboratory workup including CBC with differential, metabolic panel, lipase and urinalysis as well as CT abdomen pelvis  Will provide IV fluids for rehydration and Toradol for analgesia  Amount and/or Complexity of Data Reviewed Labs: ordered. Radiology: ordered.  Risk Prescription drug management.   ***  {Document critical care time when appropriate:1} {Document review of labs and clinical decision tools ie heart score, Chads2Vasc2 etc:1}  {Document your independent review of radiology images, and any outside records:1} {Document your discussion with family members, caretakers, and with consultants:1} {Document social determinants of health affecting pt's care:1} {Document  your decision making why or why not admission, treatments were needed:1} Final Clinical Impression(s) / ED Diagnoses Final diagnoses:  None    Rx / DC Orders ED Discharge Orders     None

## 2023-06-19 MED ORDER — MORPHINE SULFATE 15 MG PO TABS: 15.0000 mg | ORAL_TABLET | Freq: Three times a day (TID) | ORAL | 0 refills | Status: AC | PRN

## 2023-06-19 MED ORDER — TAMSULOSIN HCL 0.4 MG PO CAPS: 0.4000 mg | ORAL_CAPSULE | Freq: Every day | ORAL | 0 refills | Status: AC

## 2023-06-19 NOTE — Discharge Instructions (Signed)
You were seen in the Emergency Department for left side abdominal pain The CAT scan showed 2 kidney stones on the left side For this reason we gave you a dose of Flomax here which can help move kidney stones along We will call in more Flomax to your pharmacy for you to pick up and begin taking as directed to help with the kidney stones We also called in morphine to your pharmacy which she should take as directed for severe pain only Do not drink alcohol drive while taking morphine As discussed, you should be taking ibuprofen every 6 hours as directed for mild to moderate pain Please follow-up with Dr.Frisbie in urology to be seen in the office Return to the emerged part for severe pain, if you are unable to urinate or for any other concerns
